# Patient Record
Sex: Female | Born: 1945 | Race: Black or African American | Hispanic: No | Smoking: Never smoker
Health system: Southern US, Community
[De-identification: ages and names within clinical notes are randomized; demographics above are authoritative.]

## PROBLEM LIST (undated history)

## (undated) DIAGNOSIS — M545 Low back pain, unspecified: Secondary | ICD-10-CM

## (undated) DIAGNOSIS — I10 Essential (primary) hypertension: Secondary | ICD-10-CM

## (undated) DIAGNOSIS — J810 Acute pulmonary edema: Secondary | ICD-10-CM

## (undated) DIAGNOSIS — E119 Type 2 diabetes mellitus without complications: Secondary | ICD-10-CM

## (undated) DIAGNOSIS — I495 Sick sinus syndrome: Secondary | ICD-10-CM

## (undated) DIAGNOSIS — N182 Chronic kidney disease, stage 2 (mild): Secondary | ICD-10-CM

## (undated) DIAGNOSIS — Z860100 Personal history of colon polyps, unspecified: Secondary | ICD-10-CM

## (undated) DIAGNOSIS — I5032 Chronic diastolic (congestive) heart failure: Secondary | ICD-10-CM

## (undated) DIAGNOSIS — N184 Chronic kidney disease, stage 4 (severe): Secondary | ICD-10-CM

## (undated) DIAGNOSIS — I2510 Atherosclerotic heart disease of native coronary artery without angina pectoris: Secondary | ICD-10-CM

## (undated) DIAGNOSIS — E785 Hyperlipidemia, unspecified: Secondary | ICD-10-CM

## (undated) DIAGNOSIS — D649 Anemia, unspecified: Secondary | ICD-10-CM

## (undated) DIAGNOSIS — M1990 Unspecified osteoarthritis, unspecified site: Secondary | ICD-10-CM

## (undated) DIAGNOSIS — K219 Gastro-esophageal reflux disease without esophagitis: Secondary | ICD-10-CM

## (undated) DIAGNOSIS — Z905 Acquired absence of kidney: Secondary | ICD-10-CM

## (undated) HISTORY — DX: Unspecified osteoarthritis, unspecified site: M19.90

## (undated) HISTORY — PX: BACK SURGERY: SHX140

## (undated) HISTORY — PX: CHOLECYSTECTOMY: SHX55

## (undated) HISTORY — PX: TONSILLECTOMY: SUR1361

## (undated) HISTORY — PX: ABDOMINAL HYSTERECTOMY: SUR658

## (undated) HISTORY — DX: Personal history of colonic polyps: Z86.010

## (undated) HISTORY — DX: Atherosclerotic heart disease of native coronary artery without angina pectoris: I25.10

## (undated) HISTORY — DX: Gastro-esophageal reflux disease without esophagitis: K21.9

## (undated) HISTORY — PX: COLONOSCOPY W/ POLYPECTOMY: SHX1380

## (undated) HISTORY — DX: Sick sinus syndrome: I49.5

## (undated) HISTORY — DX: Personal history of colon polyps, unspecified: Z86.0100

## (undated) HISTORY — DX: Type 2 diabetes mellitus without complications: E11.9

## (undated) HISTORY — DX: Essential (primary) hypertension: I10

## (undated) HISTORY — DX: Low back pain, unspecified: M54.50

## (undated) HISTORY — DX: Chronic diastolic (congestive) heart failure: I50.32

## (undated) HISTORY — DX: Hyperlipidemia, unspecified: E78.5

## (undated) HISTORY — DX: Low back pain: M54.5

## (undated) HISTORY — PX: NEPHRECTOMY: SHX65

## (undated) HISTORY — PX: INSERT / REPLACE / REMOVE PACEMAKER: SUR710

## (undated) HISTORY — DX: Chronic kidney disease, stage 2 (mild): N18.2

## (undated) HISTORY — PX: KNEE SURGERY: SHX244

## (undated) HISTORY — PX: CATARACT EXTRACTION: SUR2

---

## 1998-02-04 ENCOUNTER — Ambulatory Visit (HOSPITAL_COMMUNITY): Admission: RE | Admit: 1998-02-04 | Discharge: 1998-02-04 | Payer: Self-pay | Admitting: Cardiology

## 2000-07-22 ENCOUNTER — Ambulatory Visit (HOSPITAL_COMMUNITY): Admission: RE | Admit: 2000-07-22 | Discharge: 2000-07-22 | Payer: Self-pay | Admitting: Internal Medicine

## 2000-07-22 ENCOUNTER — Encounter: Payer: Self-pay | Admitting: Internal Medicine

## 2001-01-19 ENCOUNTER — Encounter: Payer: Self-pay | Admitting: Urology

## 2001-01-19 ENCOUNTER — Ambulatory Visit (HOSPITAL_COMMUNITY): Admission: RE | Admit: 2001-01-19 | Discharge: 2001-01-19 | Payer: Self-pay | Admitting: Urology

## 2001-01-28 ENCOUNTER — Encounter: Payer: Self-pay | Admitting: Orthopaedic Surgery

## 2001-01-28 ENCOUNTER — Ambulatory Visit (HOSPITAL_COMMUNITY): Admission: RE | Admit: 2001-01-28 | Discharge: 2001-01-28 | Payer: Self-pay | Admitting: Orthopaedic Surgery

## 2001-04-05 ENCOUNTER — Inpatient Hospital Stay (HOSPITAL_COMMUNITY): Admission: AD | Admit: 2001-04-05 | Discharge: 2001-04-08 | Payer: Self-pay | Admitting: Cardiology

## 2001-04-05 ENCOUNTER — Encounter: Payer: Self-pay | Admitting: Emergency Medicine

## 2001-04-06 ENCOUNTER — Encounter: Payer: Self-pay | Admitting: Cardiology

## 2001-04-07 ENCOUNTER — Encounter: Payer: Self-pay | Admitting: Cardiology

## 2001-07-26 ENCOUNTER — Encounter: Payer: Self-pay | Admitting: Urology

## 2001-07-26 ENCOUNTER — Ambulatory Visit (HOSPITAL_COMMUNITY): Admission: RE | Admit: 2001-07-26 | Discharge: 2001-07-26 | Payer: Self-pay | Admitting: Urology

## 2001-10-11 ENCOUNTER — Encounter: Payer: Self-pay | Admitting: Family Medicine

## 2001-10-11 ENCOUNTER — Ambulatory Visit (HOSPITAL_COMMUNITY): Admission: RE | Admit: 2001-10-11 | Discharge: 2001-10-11 | Payer: Self-pay | Admitting: Family Medicine

## 2001-11-15 ENCOUNTER — Ambulatory Visit (HOSPITAL_COMMUNITY): Admission: RE | Admit: 2001-11-15 | Discharge: 2001-11-15 | Payer: Self-pay | Admitting: Orthopaedic Surgery

## 2001-11-15 ENCOUNTER — Encounter: Payer: Self-pay | Admitting: Orthopaedic Surgery

## 2002-01-17 ENCOUNTER — Ambulatory Visit (HOSPITAL_COMMUNITY): Admission: RE | Admit: 2002-01-17 | Discharge: 2002-01-17 | Payer: Self-pay | Admitting: Orthopaedic Surgery

## 2002-01-17 ENCOUNTER — Encounter: Payer: Self-pay | Admitting: Orthopaedic Surgery

## 2002-02-11 ENCOUNTER — Encounter: Payer: Self-pay | Admitting: Emergency Medicine

## 2002-02-11 ENCOUNTER — Emergency Department (HOSPITAL_COMMUNITY): Admission: EM | Admit: 2002-02-11 | Discharge: 2002-02-11 | Payer: Self-pay | Admitting: Emergency Medicine

## 2002-03-21 ENCOUNTER — Ambulatory Visit (HOSPITAL_COMMUNITY): Admission: RE | Admit: 2002-03-21 | Discharge: 2002-03-21 | Payer: Self-pay | Admitting: Orthopaedic Surgery

## 2002-03-27 ENCOUNTER — Encounter: Payer: Self-pay | Admitting: Orthopaedic Surgery

## 2002-03-27 ENCOUNTER — Encounter: Admission: RE | Admit: 2002-03-27 | Discharge: 2002-03-27 | Payer: Self-pay | Admitting: Orthopaedic Surgery

## 2002-08-22 ENCOUNTER — Encounter: Payer: Self-pay | Admitting: Family Medicine

## 2002-08-22 ENCOUNTER — Ambulatory Visit (HOSPITAL_COMMUNITY): Admission: RE | Admit: 2002-08-22 | Discharge: 2002-08-22 | Payer: Self-pay | Admitting: Family Medicine

## 2002-09-21 DIAGNOSIS — I495 Sick sinus syndrome: Secondary | ICD-10-CM

## 2002-09-21 HISTORY — DX: Sick sinus syndrome: I49.5

## 2002-12-20 ENCOUNTER — Encounter: Payer: Self-pay | Admitting: Family Medicine

## 2002-12-20 ENCOUNTER — Ambulatory Visit (HOSPITAL_COMMUNITY): Admission: RE | Admit: 2002-12-20 | Discharge: 2002-12-20 | Payer: Self-pay | Admitting: Family Medicine

## 2003-05-24 ENCOUNTER — Encounter: Payer: Self-pay | Admitting: Family Medicine

## 2003-05-24 ENCOUNTER — Ambulatory Visit (HOSPITAL_COMMUNITY): Admission: RE | Admit: 2003-05-24 | Discharge: 2003-05-24 | Payer: Self-pay | Admitting: Family Medicine

## 2003-05-29 ENCOUNTER — Inpatient Hospital Stay (HOSPITAL_COMMUNITY): Admission: AD | Admit: 2003-05-29 | Discharge: 2003-06-01 | Payer: Self-pay | Admitting: *Deleted

## 2003-10-27 ENCOUNTER — Encounter: Admission: RE | Admit: 2003-10-27 | Discharge: 2003-10-27 | Payer: Self-pay | Admitting: Orthopaedic Surgery

## 2003-11-26 ENCOUNTER — Ambulatory Visit (HOSPITAL_COMMUNITY): Admission: RE | Admit: 2003-11-26 | Discharge: 2003-11-26 | Payer: Self-pay | Admitting: Urology

## 2004-02-11 ENCOUNTER — Ambulatory Visit (HOSPITAL_COMMUNITY): Admission: RE | Admit: 2004-02-11 | Discharge: 2004-02-11 | Payer: Self-pay | Admitting: Family Medicine

## 2004-02-14 ENCOUNTER — Emergency Department (HOSPITAL_COMMUNITY): Admission: EM | Admit: 2004-02-14 | Discharge: 2004-02-14 | Payer: Self-pay | Admitting: Emergency Medicine

## 2004-05-28 ENCOUNTER — Ambulatory Visit (HOSPITAL_COMMUNITY): Admission: RE | Admit: 2004-05-28 | Discharge: 2004-05-28 | Payer: Self-pay | Admitting: Urology

## 2004-06-04 ENCOUNTER — Ambulatory Visit (HOSPITAL_COMMUNITY): Admission: RE | Admit: 2004-06-04 | Discharge: 2004-06-04 | Payer: Self-pay | Admitting: Urology

## 2004-11-10 ENCOUNTER — Inpatient Hospital Stay (HOSPITAL_COMMUNITY): Admission: EM | Admit: 2004-11-10 | Discharge: 2004-11-13 | Payer: Self-pay | Admitting: Family Medicine

## 2004-11-10 ENCOUNTER — Ambulatory Visit: Payer: Self-pay | Admitting: Internal Medicine

## 2004-11-11 ENCOUNTER — Encounter: Payer: Self-pay | Admitting: Internal Medicine

## 2004-11-11 ENCOUNTER — Encounter: Payer: Self-pay | Admitting: Cardiology

## 2004-11-26 ENCOUNTER — Ambulatory Visit: Payer: Self-pay

## 2004-11-26 ENCOUNTER — Ambulatory Visit: Payer: Self-pay | Admitting: Internal Medicine

## 2004-11-27 ENCOUNTER — Ambulatory Visit: Payer: Self-pay | Admitting: *Deleted

## 2005-02-12 ENCOUNTER — Ambulatory Visit (HOSPITAL_COMMUNITY): Admission: RE | Admit: 2005-02-12 | Discharge: 2005-02-12 | Payer: Self-pay | Admitting: Family Medicine

## 2005-03-05 ENCOUNTER — Ambulatory Visit: Payer: Self-pay | Admitting: Internal Medicine

## 2005-05-07 ENCOUNTER — Ambulatory Visit: Payer: Self-pay | Admitting: *Deleted

## 2005-06-01 ENCOUNTER — Encounter (HOSPITAL_COMMUNITY): Admission: RE | Admit: 2005-06-01 | Discharge: 2005-06-19 | Payer: Self-pay | Admitting: *Deleted

## 2005-06-11 ENCOUNTER — Encounter: Admission: RE | Admit: 2005-06-11 | Discharge: 2005-06-11 | Payer: Self-pay | Admitting: Orthopaedic Surgery

## 2005-06-22 ENCOUNTER — Encounter (HOSPITAL_COMMUNITY): Admission: RE | Admit: 2005-06-22 | Discharge: 2005-07-22 | Payer: Self-pay | Admitting: *Deleted

## 2005-07-24 ENCOUNTER — Encounter (HOSPITAL_COMMUNITY): Admission: RE | Admit: 2005-07-24 | Discharge: 2005-08-23 | Payer: Self-pay | Admitting: *Deleted

## 2005-08-24 ENCOUNTER — Encounter (HOSPITAL_COMMUNITY): Admission: RE | Admit: 2005-08-24 | Discharge: 2005-09-18 | Payer: Self-pay | Admitting: *Deleted

## 2005-09-09 ENCOUNTER — Ambulatory Visit: Payer: Self-pay | Admitting: Cardiology

## 2005-09-15 ENCOUNTER — Ambulatory Visit: Payer: Self-pay | Admitting: Cardiology

## 2005-09-15 ENCOUNTER — Encounter (HOSPITAL_COMMUNITY): Admission: RE | Admit: 2005-09-15 | Discharge: 2005-09-16 | Payer: Self-pay | Admitting: Cardiology

## 2005-09-23 ENCOUNTER — Ambulatory Visit: Payer: Self-pay | Admitting: *Deleted

## 2005-09-23 ENCOUNTER — Encounter (HOSPITAL_COMMUNITY): Admission: RE | Admit: 2005-09-23 | Discharge: 2005-10-23 | Payer: Self-pay | Admitting: *Deleted

## 2005-09-24 ENCOUNTER — Ambulatory Visit (HOSPITAL_COMMUNITY): Admission: RE | Admit: 2005-09-24 | Discharge: 2005-09-24 | Payer: Self-pay | Admitting: *Deleted

## 2005-09-28 ENCOUNTER — Inpatient Hospital Stay (HOSPITAL_BASED_OUTPATIENT_CLINIC_OR_DEPARTMENT_OTHER): Admission: RE | Admit: 2005-09-28 | Discharge: 2005-09-28 | Payer: Self-pay | Admitting: Cardiology

## 2005-09-28 ENCOUNTER — Ambulatory Visit: Payer: Self-pay | Admitting: Cardiovascular Disease

## 2005-10-13 ENCOUNTER — Ambulatory Visit: Payer: Self-pay | Admitting: *Deleted

## 2005-11-05 ENCOUNTER — Ambulatory Visit: Payer: Self-pay | Admitting: *Deleted

## 2006-02-01 ENCOUNTER — Ambulatory Visit: Payer: Self-pay | Admitting: Internal Medicine

## 2006-04-26 ENCOUNTER — Ambulatory Visit: Payer: Self-pay | Admitting: Internal Medicine

## 2006-05-03 ENCOUNTER — Ambulatory Visit: Payer: Self-pay | Admitting: Internal Medicine

## 2006-05-21 ENCOUNTER — Ambulatory Visit: Payer: Self-pay | Admitting: *Deleted

## 2006-05-25 ENCOUNTER — Ambulatory Visit: Payer: Self-pay | Admitting: Internal Medicine

## 2006-06-15 ENCOUNTER — Ambulatory Visit: Payer: Self-pay | Admitting: Internal Medicine

## 2006-07-23 ENCOUNTER — Ambulatory Visit (HOSPITAL_COMMUNITY): Admission: RE | Admit: 2006-07-23 | Discharge: 2006-07-23 | Payer: Self-pay | Admitting: Family Medicine

## 2006-08-11 ENCOUNTER — Ambulatory Visit: Payer: Self-pay | Admitting: Internal Medicine

## 2006-09-29 ENCOUNTER — Ambulatory Visit (HOSPITAL_COMMUNITY): Admission: RE | Admit: 2006-09-29 | Discharge: 2006-09-29 | Payer: Self-pay | Admitting: Family Medicine

## 2006-10-01 ENCOUNTER — Ambulatory Visit: Payer: Self-pay | Admitting: Internal Medicine

## 2006-10-26 ENCOUNTER — Encounter: Payer: Self-pay | Admitting: Internal Medicine

## 2006-10-26 DIAGNOSIS — E119 Type 2 diabetes mellitus without complications: Secondary | ICD-10-CM

## 2006-10-26 DIAGNOSIS — I10 Essential (primary) hypertension: Secondary | ICD-10-CM | POA: Insufficient documentation

## 2006-10-26 DIAGNOSIS — M545 Low back pain: Secondary | ICD-10-CM

## 2006-10-26 DIAGNOSIS — C649 Malignant neoplasm of unspecified kidney, except renal pelvis: Secondary | ICD-10-CM | POA: Insufficient documentation

## 2006-10-26 DIAGNOSIS — K219 Gastro-esophageal reflux disease without esophagitis: Secondary | ICD-10-CM | POA: Insufficient documentation

## 2006-10-26 DIAGNOSIS — IMO0002 Reserved for concepts with insufficient information to code with codable children: Secondary | ICD-10-CM

## 2006-10-26 DIAGNOSIS — E785 Hyperlipidemia, unspecified: Secondary | ICD-10-CM

## 2006-10-26 DIAGNOSIS — J309 Allergic rhinitis, unspecified: Secondary | ICD-10-CM | POA: Insufficient documentation

## 2006-11-26 ENCOUNTER — Ambulatory Visit: Payer: Self-pay | Admitting: Internal Medicine

## 2007-01-21 ENCOUNTER — Ambulatory Visit: Payer: Self-pay | Admitting: Internal Medicine

## 2007-03-11 ENCOUNTER — Ambulatory Visit: Payer: Self-pay | Admitting: Internal Medicine

## 2007-04-18 ENCOUNTER — Ambulatory Visit: Payer: Self-pay | Admitting: Internal Medicine

## 2007-05-06 ENCOUNTER — Ambulatory Visit: Payer: Self-pay | Admitting: Internal Medicine

## 2007-07-22 ENCOUNTER — Ambulatory Visit: Payer: Self-pay | Admitting: Internal Medicine

## 2007-08-01 ENCOUNTER — Ambulatory Visit (HOSPITAL_COMMUNITY): Admission: RE | Admit: 2007-08-01 | Discharge: 2007-08-02 | Payer: Self-pay | Admitting: Internal Medicine

## 2007-08-01 ENCOUNTER — Ambulatory Visit: Payer: Self-pay | Admitting: Cardiology

## 2007-08-02 ENCOUNTER — Ambulatory Visit: Payer: Self-pay | Admitting: Cardiology

## 2007-08-04 ENCOUNTER — Ambulatory Visit: Payer: Self-pay | Admitting: Cardiology

## 2007-08-26 ENCOUNTER — Ambulatory Visit: Payer: Self-pay | Admitting: Internal Medicine

## 2007-08-29 ENCOUNTER — Ambulatory Visit (HOSPITAL_COMMUNITY): Admission: RE | Admit: 2007-08-29 | Discharge: 2007-08-29 | Payer: Self-pay | Admitting: Family Medicine

## 2007-10-26 ENCOUNTER — Ambulatory Visit: Payer: Self-pay | Admitting: Internal Medicine

## 2007-12-16 ENCOUNTER — Encounter: Admission: RE | Admit: 2007-12-16 | Discharge: 2007-12-16 | Payer: Self-pay | Admitting: Orthopaedic Surgery

## 2008-01-13 ENCOUNTER — Ambulatory Visit: Payer: Self-pay | Admitting: Internal Medicine

## 2008-02-27 ENCOUNTER — Ambulatory Visit: Payer: Self-pay | Admitting: Internal Medicine

## 2008-05-29 ENCOUNTER — Ambulatory Visit: Payer: Self-pay | Admitting: Internal Medicine

## 2008-09-18 ENCOUNTER — Emergency Department (HOSPITAL_COMMUNITY): Admission: EM | Admit: 2008-09-18 | Discharge: 2008-09-18 | Payer: Self-pay | Admitting: Emergency Medicine

## 2008-10-01 ENCOUNTER — Ambulatory Visit: Payer: Self-pay | Admitting: Internal Medicine

## 2008-12-18 DIAGNOSIS — R5383 Other fatigue: Secondary | ICD-10-CM

## 2008-12-18 DIAGNOSIS — R5381 Other malaise: Secondary | ICD-10-CM

## 2008-12-31 ENCOUNTER — Ambulatory Visit: Payer: Self-pay | Admitting: Internal Medicine

## 2009-04-01 ENCOUNTER — Ambulatory Visit: Payer: Self-pay | Admitting: Internal Medicine

## 2009-04-01 ENCOUNTER — Encounter: Payer: Self-pay | Admitting: Internal Medicine

## 2009-04-19 ENCOUNTER — Encounter (INDEPENDENT_AMBULATORY_CARE_PROVIDER_SITE_OTHER): Payer: Self-pay | Admitting: *Deleted

## 2009-04-24 ENCOUNTER — Ambulatory Visit (HOSPITAL_COMMUNITY): Admission: RE | Admit: 2009-04-24 | Discharge: 2009-04-24 | Payer: Self-pay | Admitting: Family Medicine

## 2009-06-05 ENCOUNTER — Encounter (HOSPITAL_COMMUNITY): Admission: RE | Admit: 2009-06-05 | Discharge: 2009-06-20 | Payer: Self-pay | Admitting: Orthopedic Surgery

## 2009-06-07 ENCOUNTER — Ambulatory Visit: Payer: Self-pay | Admitting: Gastroenterology

## 2009-06-07 DIAGNOSIS — Z860100 Personal history of colon polyps, unspecified: Secondary | ICD-10-CM | POA: Insufficient documentation

## 2009-06-10 ENCOUNTER — Encounter: Payer: Self-pay | Admitting: Gastroenterology

## 2009-06-12 ENCOUNTER — Encounter (INDEPENDENT_AMBULATORY_CARE_PROVIDER_SITE_OTHER): Payer: Self-pay | Admitting: *Deleted

## 2009-06-12 LAB — CONVERTED CEMR LAB
BUN: 28 mg/dL
CO2: 27 meq/L
Chloride: 102 meq/L
Creatinine, Ser: 1.18 mg/dL
Glucose, Bld: 117 mg/dL
Hgb A1c MFr Bld: 8.3 %
LDL Cholesterol: 153 mg/dL
Total Protein: 7.6 g/dL

## 2009-06-18 ENCOUNTER — Ambulatory Visit (HOSPITAL_COMMUNITY): Admission: RE | Admit: 2009-06-18 | Discharge: 2009-06-18 | Payer: Self-pay | Admitting: Gastroenterology

## 2009-06-18 ENCOUNTER — Ambulatory Visit: Payer: Self-pay | Admitting: Gastroenterology

## 2009-06-24 ENCOUNTER — Encounter (HOSPITAL_COMMUNITY): Admission: RE | Admit: 2009-06-24 | Discharge: 2009-07-24 | Payer: Self-pay | Admitting: Orthopedic Surgery

## 2009-07-01 ENCOUNTER — Ambulatory Visit: Payer: Self-pay | Admitting: Internal Medicine

## 2009-10-15 ENCOUNTER — Ambulatory Visit: Payer: Self-pay | Admitting: Internal Medicine

## 2009-10-16 ENCOUNTER — Encounter (INDEPENDENT_AMBULATORY_CARE_PROVIDER_SITE_OTHER): Payer: Self-pay | Admitting: *Deleted

## 2009-12-10 ENCOUNTER — Encounter (INDEPENDENT_AMBULATORY_CARE_PROVIDER_SITE_OTHER): Payer: Self-pay | Admitting: *Deleted

## 2009-12-15 DIAGNOSIS — I495 Sick sinus syndrome: Secondary | ICD-10-CM

## 2009-12-15 DIAGNOSIS — Z950 Presence of cardiac pacemaker: Secondary | ICD-10-CM

## 2009-12-16 ENCOUNTER — Ambulatory Visit (HOSPITAL_COMMUNITY): Admission: RE | Admit: 2009-12-16 | Discharge: 2009-12-16 | Payer: Self-pay | Admitting: Cardiology

## 2009-12-16 ENCOUNTER — Ambulatory Visit: Payer: Self-pay | Admitting: Cardiology

## 2009-12-17 ENCOUNTER — Encounter: Payer: Self-pay | Admitting: Cardiology

## 2009-12-17 LAB — CONVERTED CEMR LAB: Pro B Natriuretic peptide (BNP): 21.2 pg/mL (ref 0.0–100.0)

## 2009-12-18 ENCOUNTER — Encounter: Payer: Self-pay | Admitting: Cardiology

## 2009-12-19 ENCOUNTER — Encounter (INDEPENDENT_AMBULATORY_CARE_PROVIDER_SITE_OTHER): Payer: Self-pay | Admitting: *Deleted

## 2009-12-30 ENCOUNTER — Ambulatory Visit: Admission: RE | Admit: 2009-12-30 | Discharge: 2009-12-30 | Payer: Self-pay | Admitting: *Deleted

## 2010-01-06 ENCOUNTER — Ambulatory Visit: Payer: Self-pay | Admitting: Pulmonary Disease

## 2010-01-10 ENCOUNTER — Ambulatory Visit (HOSPITAL_COMMUNITY): Admission: RE | Admit: 2010-01-10 | Discharge: 2010-01-10 | Payer: Self-pay | Admitting: Cardiology

## 2010-01-10 ENCOUNTER — Encounter: Payer: Self-pay | Admitting: Cardiology

## 2010-01-13 ENCOUNTER — Encounter (INDEPENDENT_AMBULATORY_CARE_PROVIDER_SITE_OTHER): Payer: Self-pay | Admitting: *Deleted

## 2010-01-15 ENCOUNTER — Encounter: Payer: Self-pay | Admitting: *Deleted

## 2010-01-17 ENCOUNTER — Encounter: Payer: Self-pay | Admitting: Cardiology

## 2010-01-27 ENCOUNTER — Ambulatory Visit: Payer: Self-pay | Admitting: Cardiology

## 2010-01-27 ENCOUNTER — Encounter: Payer: Self-pay | Admitting: Internal Medicine

## 2010-01-27 ENCOUNTER — Encounter (INDEPENDENT_AMBULATORY_CARE_PROVIDER_SITE_OTHER): Payer: Self-pay | Admitting: *Deleted

## 2010-01-27 DIAGNOSIS — M109 Gout, unspecified: Secondary | ICD-10-CM | POA: Insufficient documentation

## 2010-01-28 ENCOUNTER — Encounter: Payer: Self-pay | Admitting: Cardiology

## 2010-01-29 ENCOUNTER — Telehealth (INDEPENDENT_AMBULATORY_CARE_PROVIDER_SITE_OTHER): Payer: Self-pay | Admitting: *Deleted

## 2010-03-21 ENCOUNTER — Encounter (INDEPENDENT_AMBULATORY_CARE_PROVIDER_SITE_OTHER): Payer: Self-pay | Admitting: *Deleted

## 2010-03-21 LAB — CONVERTED CEMR LAB
CO2: 27 meq/L
Chloride: 98 meq/L
Creatinine, Ser: 1.22 mg/dL
LDL Cholesterol: 138 mg/dL
Sodium: 137 meq/L

## 2010-03-26 ENCOUNTER — Ambulatory Visit: Payer: Self-pay | Admitting: Cardiovascular Disease

## 2010-03-28 ENCOUNTER — Encounter (INDEPENDENT_AMBULATORY_CARE_PROVIDER_SITE_OTHER): Payer: Self-pay | Admitting: *Deleted

## 2010-03-28 LAB — CONVERTED CEMR LAB
CO2: 27 meq/L (ref 19–32)
Calcium: 9.4 mg/dL (ref 8.4–10.5)
HDL: 30 mg/dL — ABNORMAL LOW (ref >39)
Potassium: 3.7 meq/L (ref 3.5–5.3)
Sodium: 137 meq/L (ref 135–145)
Total CHOL/HDL Ratio: 7.6
Uric Acid, Serum: 7.5 mg/dL — ABNORMAL HIGH (ref 2.4–7.0)

## 2010-04-01 ENCOUNTER — Encounter (INDEPENDENT_AMBULATORY_CARE_PROVIDER_SITE_OTHER): Payer: Self-pay | Admitting: *Deleted

## 2010-08-15 ENCOUNTER — Encounter (INDEPENDENT_AMBULATORY_CARE_PROVIDER_SITE_OTHER): Payer: Self-pay | Admitting: *Deleted

## 2010-09-25 ENCOUNTER — Ambulatory Visit: Admit: 2010-09-25 | Payer: Self-pay | Admitting: Internal Medicine

## 2010-09-26 ENCOUNTER — Telehealth (INDEPENDENT_AMBULATORY_CARE_PROVIDER_SITE_OTHER): Payer: Self-pay

## 2010-10-10 ENCOUNTER — Encounter: Payer: Self-pay | Admitting: Internal Medicine

## 2010-10-12 ENCOUNTER — Encounter: Payer: Self-pay | Admitting: Family Medicine

## 2010-10-21 NOTE — Assessment & Plan Note (Signed)
Summary: ec6/sorness on side with pacemaker  Medications Added * LASIX 20 MG TABS (FUROSEMIDE) takes prn TRIBENZOR 40-10-25 MG TABS (OLMESARTAN-AMLODIPINE-HCTZ) take 1 tab daily FISH OIL 500 MG CAPS (OMEGA-3 FATTY ACIDS) take 1 cap two times a day B COMPLEX  TABS (B COMPLEX VITAMINS) take 1 tab daily SPIRONOLACTONE 25 MG TABS (SPIRONOLACTONE) take 1 tab two times a day SIMVASTATIN 20 MG TABS (SIMVASTATIN) Take one tablet by mouth daily at bedtime PRAVACHOL 20 MG TABS (PRAVASTATIN SODIUM) Take 1 tablet by mouth once a day      Allergies Added: ! SIMVASTATIN  Visit Type:  Follow-up Primary Provider:  Dr. Mirna Mires   History of Present Illness: Alyssa [name] is seen in the office today at her request, having been lost to followup since 2009.  She was last seen by Dr. Tenny Craw in preparation for a major orthopaedic surgical procedure to correct her scoliosis.  She did well with sustained relief of her low back pain; however, she has subsequently developed upper back pain.  She is able to walk only short distances without dyspnea.  She is disabled due to chronic back problems, walks with a limp and cannot exercise on a treadmill.  She has had no chest discomfort, orthopnea, PND or palpitations.  Her peak weight has been 246 in recent years.  She lost approximately 35 pounds following surgery, but has subsequently regained to 229, 11 pounds more than in April of 2009.  Finally, she complains of left upper chest aching discomfort of mild to moderate intensity radiating to the left shoulder.  Symptoms are increased with movement of the limb.  There is no chest wall tenderness nor is there a pleuritic component.  Alyssa [name] has been told of significant snoring.  She notes daytime somnolence and fatigue.  She has never been assessed for sleep apnea.    Current Medications (verified): 1)  Prilosec 20 Mg Cpdr (Omeprazole) .... Two Times A Day 2)  Lasix 20 Mg Tabs (Furosemide) .... Takes Prn 3)   Catapres 0.3 Mg Tabs (Clonidine Hcl) .... Take 1 Tablet By Mouth Three Times A Day 4)  Metoprolol Tartrate 50 Mg Tabs (Metoprolol Tartrate) .... Two Times A Day 5)  Allopurinol 100 Mg Tabs (Allopurinol) .... Take 1 Tablet By Mouth Once A Day 6)  Humerlin  70/30 .... Sliding Scale 7)  Tribenzor 40-10-25 Mg Tabs (Olmesartan-Amlodipine-Hctz) .... Take 1 Tab Daily 8)  Fish Oil 500 Mg Caps (Omega-3 Fatty Acids) .... Take 1 Cap Two Times A Day 9)  B Complex  Tabs (B Complex Vitamins) .... Take 1 Tab Daily 10)  Spironolactone 25 Mg Tabs (Spironolactone) .... Take 1 Tab Two Times A Day 11)  Pravachol 20 Mg Tabs (Pravastatin Sodium) .... Take 1 Tablet By Mouth Once A Day  Allergies (verified): 1)  ! Simvastatin  Past History:  PMH, FH, and Social History reviewed and updated.  Review of Systems       See history of present illness.  Vital Signs:  Patient profile:   65 year old female Weight:      229 pounds O2 Sat:      99 % on Room air Pulse rate:   64 / minute BP sitting:   158 / 65  (right arm)  Vitals Entered By: Dreama Saa, CNA (December 16, 2009 2:24 PM)  O2 Flow:  Room air  Physical Exam  General:    Obese; well developed; no acute distress:   Neck-No JVD; no carotid bruits: Lungs-No tachypnea,  no rales; no rhonchi; no wheezes: Thorax-healed surgical scar extending from the upper thoracic region to the lower lumbar region; pacing generator in the left infraclavicular area.  Tenderness where leads exit from the device.  No evidence for infection. Cardiovascular-normal PMI; normal S1 and S2; modest systolic murmur Abdomen-BS normal; soft and non-tender without masses or organomegaly:  Musculoskeletal-No deformities, no cyanosis or clubbing: Neurologic-Normal cranial nerves; symmetric strength and tone:  Skin-Warm, no significant lesions: Extremities-Nl distal pulses; trace edema:     Impression & Recommendations:  Problem # 1:  SICK SINUS SYNDROME  (ICD-427.81) Pacemaker function was appropriate when last assessed in the office nearly 2 years ago.  Patient has been involved with transtelephonic monitoring since then.  A return visit for pacer interrogation will be scheduled with Dr. Ladona Ridgel.  Problem # 2:  HYPERTENSION (ICD-401.9) Blood pressure control is somewhat suboptimal.  Medical therapy will likely need to be intensified.  Problem # 3:  DYSPNEA (ICD-786.05) Dyspnea is Alyssa [name]'s most significant symptom.  Based upon her previous normal cardiac test, this is not likely of cardiac origin.  Obesity may be causing sleep apnea and/or restrictive lung disease.  A chest x-ray, PFTs and a sleep study will be obtained.  A 6 minute walk was performed.  Exercise tolerance was poor with the patient covering only 300 feet.  Oxygen saturation decreased modestly with exercise.  Initial rhythm was sinus bradycardia competing with the electronic pacemaker plus a junctional rhythm.  With exercise, sinus rhythm was present with atrial sensing and ventricular pacing.  It is unclear at this point whether his symptoms could all be explained by the patient's obesity.  Alternative explanations will be sought as appropriate.  I will see this nice woman again in one month.  Orders: T-TSH [phone]) T-BNP  (B Natriuretic Peptide) [phone]) T-D-Dimer Fibrin Derivatives Quantitive [phone]) T-Chest x-ray, 2 views (71020)  Problem # 4:  CHEST PAIN (ICD-786.50) Discomfort in the region of the patient's pacemaker appears benign.  Body temperature was normal today at 98.6.  There no signs to indicate infection.  Dr. Ladona Ridgel will reassess at his next office visit.  Problem # 5:  HYPERLIPIDEMIA (ICD-272.4) Due to moderate hyperlipidemia in the setting of diabetes, pharmacologic therapy is warranted.  Patient has taken simvastatin in the past, but developed her right is.  We will try pravastatin at an initial dose of 20 mg q.d., increasing the dose as  required.  Other Orders: Sleep Disorder Referral (Sleep Disorder) Pulmonary Function Test (PFT)  Patient Instructions: 1)  Your physician recommends that you schedule a follow-up appointment in: 1 month 2)  Your physician recommends that you return for lab work in: today 3)  Your physician has recommended you make the following change in your medication: start pravachol 20mg  once daily 4)  Your physician has recommended that you have a pulmonary function test.  Pulmonary Function Tests are a group of tests that measure how well air moves in and out of your lungs. 5)  Your physician has recommended that you have a sleep study.  This test records several body functions during sleep, including:  brain activity, eye movement, oxygen and carbon dioxide blood levels, heart rate and rhythm, breathing rate and rhythm, the flow of air through your mouth and nose, snoring, body muscle movements, and chest and belly movement. Prescriptions: PRAVACHOL 20 MG TABS (PRAVASTATIN SODIUM) Take 1 tablet by mouth once a day  #30 x 2   Entered by:   Teressa Lower RN  Authorized by:   Kathlen Brunswick, MD, [facility]   Signed by:   Teressa Lower RN on 12/16/2009   Method used:   Electronically to        [facility] [phone]* (retail)       [address]       [place], [place]  [address]       Ph: [phone] or [phone]       Fax: [phone]   RxID:   [id number] SIMVASTATIN 20 MG TABS (SIMVASTATIN) Take one tablet by mouth daily at bedtime  #30 x 1   Entered by:   Teressa Lower RN   Authorized by:   Kathlen Brunswick, MD, [facility]   Signed by:   Teressa Lower RN on 12/16/2009   Method used:   Electronically to        [facility] [phone]* (retail)       [address]       [place], [place]  [address]       Ph: [phone] or [phone]       Fax: [phone]   RxID:   [id number]

## 2010-10-21 NOTE — Letter (Signed)
Summary: [place] [facility] Work Engineer, agricultural at [employer]  [address], [place] [address]   Phone: [phone]  Fax: [phone]     March 28, 2010 MRN: [id number]   [facility] [address] [place], [place]  [address]      YOUR LAB WORK IS DUE   April 29, 2010  Please go to [facility], located across the street from [facility] on the second floor.  Hours are Monday - Friday 7am until 7:30pm         Saturday 8am until 12noon    _x_  DO NOT EAT OR DRINK AFTER MIDNIGHT EVENING PRIOR TO LABWORK  __ YOUR LABWORK IS NOT FASTING --YOU MAY EAT PRIOR TO LABWORK

## 2010-10-21 NOTE — Assessment & Plan Note (Signed)
Summary: 1 MTH F/U PER CHECKOUT ON 12/16/09/TG  Medications Added * LASIX 20 MG TABS (FUROSEMIDE) take 1 1/2 tablets once daily as needed CLONIDINE HCL 0.3 MG/24HR PTWK (CLONIDINE HCL) once weekly CLONIDINE HCL 0.3 MG/24HR PTWK (CLONIDINE HCL) once weekly NOVOLIN 70/30 70-30 % SUSP (INSULIN ISOPHANE & REGULAR) 60 units  in am  and 15 units in pm FISH OIL 1000 MG CAPS (OMEGA-3 FATTY ACIDS) Take 1 tablet by mouth two times a day MULTIVITAMINS  CAPS (MULTIPLE VITAMIN) Take 1 tablet by mouth once a day VERAPAMIL HCL CR 180 MG XR24H-CAP (VERAPAMIL HCL) Take one capsule by mouth daily CHLORTHALIDONE 25 MG TABS (CHLORTHALIDONE) Take 1/2  tablet by mouth daily METOPROLOL TARTRATE 50 MG TABS (METOPROLOL TARTRATE) Take one tablet by mouth twice a day METOPROLOL SUCCINATE 50 MG XR24H-TAB (METOPROLOL SUCCINATE) Take1/2  tablet by mouth daily VERAPAMIL HCL CR 180 MG XR24H-CAP (VERAPAMIL HCL) Take one capsule by mouth daily      Allergies Added:   Visit Type:  Follow-up Referring Provider:  . Primary Provider:  Dr. Mirna Mires   History of Present Illness: [name] returns to the office as scheduled for continued assessment and treatment of multiple issues including hypertension, intermittent pedal edema, hyperlipidemia and exertional dyspnea.  Since her last visit, she has had increasing problems with back PAIN.  This has resulted in a substantial decrease in physical activity.  She requires furosemide only occasionally for pedal edema.  She is uncertain as to exactly what medication she is taking.  Current Medications (verified): 1)  Prilosec 20 Mg Cpdr (Omeprazole) .... Two Times A Day 2)  Lasix 20 Mg Tabs (Furosemide) .... Take 1 1/2 Tablets Once Daily As Needed 3)  Clonidine Hcl 0.3 Mg/24hr Ptwk (Clonidine Hcl) .... Once Weekly 4)  Allopurinol 100 Mg Tabs (Allopurinol) .... Take 1 Tablet By Mouth Once A Day 5)  Novolin 70/30 70-30 % Susp (Insulin Isophane & Regular) .... 60 Units  in Am   and 15 Units in Pm 6)  Tribenzor 40-10-25 Mg Tabs (Olmesartan-Amlodipine-Hctz) .... Take 1 Tab Daily 7)  Fish Oil 1000 Mg Caps (Omega-3 Fatty Acids) .... Take 1 Tablet By Mouth Two Times A Day 8)  Multivitamins  Caps (Multiple Vitamin) .... Take 1 Tablet By Mouth Once A Day 9)  Pravachol 20 Mg Tabs (Pravastatin Sodium) .... Take 1 Tablet By Mouth Once A Day 10)  Metoprolol Tartrate 50 Mg Tabs (Metoprolol Tartrate) .... Take One Tablet By Mouth Twice A Day 11)  Verapamil Hcl Cr 180 Mg Xr24h-Cap (Verapamil Hcl) .... Take One Capsule By Mouth Daily  Allergies (verified): 1)  ! Simvastatin  Past History:  PMH, FH, and Social History reviewed and updated.  Review of Systems       See history of present illness.  Vital Signs:  Patient profile:   65 year old female Weight:      231 pounds BMI:     39.79 Pulse rate:   64 / minute BP sitting:   200 / 69  (right arm)  Vitals Entered By: Dreama Saa, CNA (Jan 27, 2010 1:17 PM)  Physical Exam  General:    Obese; well developed; no acute distress:   Neck-No JVD; no carotid bruits: Lungs-No tachypnea, no rales; no rhonchi; no wheezes: Thorax-healed surgical scar extending from the upper thoracic region to the lower lumbar region; pacing generator in the left infraclavicular area.  Tenderness where leads exit from the device. Cardiovascular-normal PMI; normal S1 and S2; modest  systolic murmur Abdomen-BS normal; soft and non-tender without masses or organomegaly:  Musculoskeletal-No deformities, no cyanosis or clubbing: Neurologic-Normal cranial nerves; symmetric strength and tone:  Skin-Warm, no significant lesions: Extremities-Nl distal pulses; trace edema:      PPM Follow Up Remote Check?  No Battery Voltage:  2.79 V     Battery Est. Longevity:  5.5 years     Pacer Dependent:  Yes       PPM Device Measurements Atrium  Amplitude: 2.8 mV, Impedance: 451 ohms, Threshold: 0.5 V at 0.4 msec Right Ventricle  Impedance: 464 ohms,  Threshold: 0.5 V at 0.4 msec  Episodes MS Episodes:  208     Percent Mode Switch:  0.25     Coumadin:  No Ventricular High Rate:  0     Atrial Pacing:  56.2%     Ventricular Pacing:  100%  Parameters Mode:  DDD     Lower Rate Limit:  60     Upper Rate Limit:  120 Paced AV Delay:  150     Sensed AV Delay:  120 Next Cardiology Appt Due:  07/22/2010 Tech Comments:  No parameter changes.  Device function normal.  ROV 6 months with Dr. Ladona Ridgel in [place]. Altha Harm, LPN  Jan 27, 9322 1:56 PM   Impression & Recommendations:  Problem # 1:  SOMNOLENCE (ICD-780.09) Sleep study showed only a few mildly obstructive episodes and was considered negative for apnea.  Symptoms may be more related to treatment with oral clonidine.  We will attempt to switch her to transdermal clonidine if her insurance will allow.  Otherwise, we will taper that drug and provide her with different antihypertensive agents.  Problem # 2:  DYSPNEA (ICD-786.05) PFTs show mild restrictive disease consistent with the patient's obesity.  There does not appear to be intrinsic lung disease.  D-dimer level was just slightly above the normal range providing no significant evidence for thromboembolism.  BNP level was entirely normal as was TSH.  Her dyspnea is likely related to physical deconditioning and obesity.  These problems are complicated by her back pain.  There will be no easy solution, but I suggested increased activity, perhaps with consideration of water aerobics.  Problem # 3:  CARDIAC PACEMAKER IN SITU (ICD-V45.01) Patient canceled her appointment last month for pacemaker assessment.  Fortunately, we can provide that service today.  Problem # 4:  HYPERTENSION (ICD-401.9) Blood pressure control is suboptimal.  Repeat measurement in the left arm sitting was 175/70.  She will require additional antihypertensive medication; however, we do not know exactly what she is taking.  We will review her medications when she can bring them  to the office.  Patient will return for nursing visit in one month to reassess blood pressure control.  She will check BP at home or at a local pharmacy if possible.  I will plan to see her again in 6 months.  Problem # 5:  HYPERLIPIDEMIA (ICD-272.4)  She is tolerating low-dose pravastatin without the symptoms caused by moderate dose simvastatin.  A repeat lipid profile will be obtained.  Future Orders: T-Lipid Profile [id number]) ... 02/27/2010  Problem # 6:  GOUT (ICD-274.9)  She is on a low dose of allopurinol.  A uric acid level will be obtained.  Future Orders: T- * Misc. Laboratory test [phone]) ... 02/27/2010  Other Orders: Future Orders: T-Basic Metabolic Panel [id number]) ... 02/27/2010  Patient Instructions: 1)  Your physician recommends that you schedule a follow-up appointment in: 6 months 2)  Your physician recommends that you return for lab work in: 1 month 3)  Your physician has recommended you make the following change in your medication: change clonidine to patches  once weekly 4)  You have been referred to nurse visit 1   month 5)  Your physician has requested that you regularly monitor and record your blood pressure readings at home.  Please use the same machine at the same time of day to check your readings and record them to bring to your follow-up visit. Prescriptions: METOPROLOL SUCCINATE 50 MG XR24H-TAB (METOPROLOL SUCCINATE) Take1/2  tablet by mouth daily  #15 x 3   Entered by:   Teressa Lower RN   Authorized by:   Kathlen Brunswick, MD, [facility]   Signed by:   Teressa Lower RN on 01/27/2010   Method used:   Electronically to        [facility] [phone]* (retail)       [address]       [place], [place]  [address]       Ph: [phone] or [phone]       Fax: [phone]   RxID:   [id number] CHLORTHALIDONE 25 MG TABS (CHLORTHALIDONE) Take 1/2  tablet by mouth daily  #15 x 3   Entered by:   Teressa Lower RN   Authorized by:    Kathlen Brunswick, MD, [facility]   Signed by:   Teressa Lower RN on 01/27/2010   Method used:   Electronically to        [facility] [phone]* (retail)       [address]       [place], [place]  [address]       Ph: [phone] or [phone]       Fax: [phone]   RxID:   [id number] VERAPAMIL HCL CR 180 MG XR24H-CAP (VERAPAMIL HCL) Take one capsule by mouth daily  #30 x 6   Entered by:   Teressa Lower RN   Authorized by:   Kathlen Brunswick, MD, [facility]   Signed by:   Teressa Lower RN on 01/27/2010   Method used:   Electronically to        [facility] [phone]* (retail)       [address]       [place], [place]  [address]       Ph: [phone] or [phone]       Fax: [phone]   RxID:   [id number] CLONIDINE HCL 0.3 MG/24HR PTWK (CLONIDINE HCL) once weekly  #4 x 6   Entered by:   Teressa Lower RN   Authorized by:   Kathlen Brunswick, MD, [facility]   Signed by:   Teressa Lower RN on 01/27/2010   Method used:   Electronically to        [facility] [phone]* (retail)       [address]       [place], [place]  [address]       Ph: [phone] or [phone]       Fax: [phone]   RxID:   [id number]

## 2010-10-21 NOTE — Letter (Signed)
Summary: [place] [facility] Work Engineer, agricultural at [employer]  [address] [place] [address]   Phone: [phone]  Fax: [phone]     Jan 27, 2010 MRN: [id number]   [facility] [address] [place], [place]  [address]      YOUR LAB WORK IS DUE  February 27, 2010 _________________________________________  Please go to [facility], located across the street from [facility] on the second floor.  Hours are Monday - Friday 7am until 7:30pm         Saturday 8am until 12noon    _x_  DO NOT EAT OR DRINK AFTER MIDNIGHT EVENING PRIOR TO LABWORK  __ YOUR LABWORK IS NOT FASTING --YOU MAY EAT PRIOR TO LABWORK

## 2010-10-21 NOTE — Cardiovascular Report (Signed)
Summary: TTM   TTM   Imported By: Roderic Ovens 11/18/2009 13:25:40  _____________________________________________________________________  External Attachment:    Type:   Image     Comment:   External Document

## 2010-10-21 NOTE — Assessment & Plan Note (Signed)
Summary: 1 MTH NURSE VISIT PER CHECKOUT ON 01/27/10/TG  Nurse Visit   Vital Signs:  Patient profile:   65 year old female Height:      64 inches Weight:      230 pounds O2 Sat:      96 % on Room air Pulse rate:   60 / minute BP sitting:   180 / 76  (left arm)  Vitals Entered By: Teressa Lower RN (March 26, 2010 11:21 AM)  O2 Flow:  Room air  Preventive Screening-Counseling & Management  Alcohol-Tobacco     Smoking Status: quit > 6 months  Current Medications (verified): 1)  Prilosec 20 Mg Cpdr (Omeprazole) .... Two Times A Day 2)  Lasix 20 Mg Tabs (Furosemide) .... Take 1 1/2 Tablets Once Daily As Needed 3)  Clonidine Hcl 0.3 Mg/24hr Ptwk (Clonidine Hcl) .... Once Weekly 4)  Allopurinol 100 Mg Tabs (Allopurinol) .... Take 1 Tablet By Mouth Once A Day 5)  Novolin 70/30 70-30 % Susp (Insulin Isophane & Regular) .... 60 Units  in Am  and 15 Units in Pm 6)  Tribenzor 40-10-25 Mg Tabs (Olmesartan-Amlodipine-Hctz) .... Take 1 Tab Daily 7)  Fish Oil 1000 Mg Caps (Omega-3 Fatty Acids) .... Take 1 Tablet By Mouth Two Times A Day 8)  Multivitamins  Caps (Multiple Vitamin) .... Take 1 Tablet By Mouth Once A Day 9)  Pravachol 20 Mg Tabs (Pravastatin Sodium) .... Take 1 Tablet By Mouth Once A Day 10)  Metoprolol Tartrate 50 Mg Tabs (Metoprolol Tartrate) .... Take One Tablet By Mouth Twice A Day 11)  Diltiazem Hcl Er Beads 120 Mg Xr24h-Cap (Diltiazem Hcl Er Beads) .... Take One Capsule By Mouth Daily  Allergies (verified): 1)  ! Simvastatin 2)  ! Verapamil Hcl (Verapamil Hcl)   Visit Type:  1 month nurse visit Referring Provider:  . Primary Provider:  Dr. Mirna Mires   History of Present Illness: S:1 month nurse visit B: pt last seen 01/27/10, pt was placed on verapamil 180mg  daily-verapamil is a new allergy  pt is now on diltiazem 120mg  daily A:pt denies complaints , bp diary  returned today   52 readings  average sbp    144                  average dbp   73 scanned bp diary into  record R:   Appended Document: 1 MTH NURSE VISIT PER CHECKOUT ON 01/27/10/TG I spoke with pt, continue current tx

## 2010-10-21 NOTE — Progress Notes (Signed)
  Medications Added DILTIAZEM HCL ER BEADS 120 MG XR24H-CAP (DILTIAZEM HCL ER BEADS) Take one capsule by mouth daily       Phone Note Call from Patient   Caller: Patient Call For: medication intolerance Summary of Call: S:  office visit on Monday B: started on verapamil 180mg  daily A:  pt c/o  feeling very jittery, restless and unable to sleep since she started this medication        no other changes were noted per pt, sbp has been 150-180's, didn't start the clonidine patches until yesterday      so she know it was the verapamil, she stated she would not be able to tolerate that kind of a side effect R:   Initial call taken by: Teressa Lower RN,  Jan 29, 2010 5:26 PM  Follow-up for Phone Call        D/C verapamil;  add to allergy list Diltiazem 120 mg once daily F/U as planned. Follow-up by: Kathlen Brunswick, MD, [facility],  Jan 30, 2010 1:09 PM  Additional Follow-up for Phone Call Additional follow up Details #1::        [facility] Additional Follow-up by: Teressa Lower RN,  Jan 30, 2010 2:52 PM   New Allergies: ! VERAPAMIL HCL (VERAPAMIL HCL) New/Updated Medications: DILTIAZEM HCL ER BEADS 120 MG XR24H-CAP (DILTIAZEM HCL ER BEADS) Take one capsule by mouth daily New Allergies: ! VERAPAMIL HCL (VERAPAMIL HCL)Prescriptions: DILTIAZEM HCL ER BEADS 120 MG XR24H-CAP (DILTIAZEM HCL ER BEADS) Take one capsule by mouth daily  #30 x 3   Entered by:   Teressa Lower RN   Authorized by:   Kathlen Brunswick, MD, [facility]   Signed by:   Teressa Lower RN on 01/30/2010   Method used:   Electronically to        [facility] [phone]* (retail)       [address]       [place], [place]  [address]       Ph: [phone] or [phone]       Fax: [phone]   RxID:   [id number]

## 2010-10-21 NOTE — Letter (Signed)
Summary: [place] Results Engineer, agricultural at [facility]  [address] [place] [address]   Phone: [phone]  Fax: [phone]      April 01, 2010 MRN: [id number]   [facility] [address] [place], [place]  [address]   Dear Ms. [facility],  Your test ordered by Selena Batten has been reviewed by your physician (or physician assistant) and was found to be normal or stable. Your physician (or physician assistant) felt no changes were needed at this time.  ____ Echocardiogram  ____ Cardiac Stress Test  __x__ Lab Work  ____ Peripheral vascular study of arms, legs or neck  ____ CT scan or X-ray  ____ Lung or Breathing test  ____ Other: Please increase your pravachol to 80mg  daily, we will repeat a lipid profile in 1 month, per Dr. Dietrich Pates.  Enclosed is a copy of your labwork for your records.  Thank you, Tammy Allyne Gee RN    [place] Bing, MD, [facility].C.Gaylord Shih, MD, F.A.C.C Lewayne Bunting, MD, F.A.C.C Nona Dell, MD, F.A.C.C Charlton Haws, MD, [facility].C.C

## 2010-10-21 NOTE — Miscellaneous (Signed)
Summary: PFT & SLEEP STUDY   Clinical Lists Changes  Observations: Added new observation of PFT RSLT: This study is being done for dyspnea.      1. Spirometry shows a mild ventilatory defect without definite       evidence of airflow obstruction.   2. Lung volumes show reduction in total lung capacity, suggesting       restrictive change.   3. DLCO is moderately reduced.   4. Arterial blood gas shows relative resting hypoxia, otherwise       normal.   5. There is no significant bronchodilator improvement.   6. Noting the patient's height and weight, some of the restrictive       change may be on the basis of body habitus and clinical correlation       is suggested.               [name], M.D.  (01/13/2010 11:02) Added new observation of SLEEP STUDY:    IMPRESSIONS-RECOMMENDATIONS:   1. Small numbers of obstructive events which do not meet the apnea-       hypopnea index criteria for the obstructive       sleep apnea syndrome.   2. The patient was noted to have a paced rhythm throughout the study.               [name], MD,FCCP  (12/30/2009 11:07)      PFT  Procedure date:  01/13/2010  Findings:      This study is being done for dyspnea.      1. Spirometry shows a mild ventilatory defect without definite       evidence of airflow obstruction.   2. Lung volumes show reduction in total lung capacity, suggesting       restrictive change.   3. DLCO is moderately reduced.   4. Arterial blood gas shows relative resting hypoxia, otherwise       normal.   5. There is no significant bronchodilator improvement.   6. Noting the patient's height and weight, some of the restrictive       change may be on the basis of body habitus and clinical correlation       is suggested.               [name], M.D.   Sleep Study  Procedure date:  12/30/2009  Findings:         IMPRESSIONS-RECOMMENDATIONS:   1. Small numbers of obstructive events  which do not meet the apnea-       hypopnea index criteria for the obstructive       sleep apnea syndrome.   2. The patient was noted to have a paced rhythm throughout the study.               [name], MD,FCCP

## 2010-10-21 NOTE — Miscellaneous (Signed)
Summary: nuclear study 08/02/2007  Clinical Lists Changes  Observations: Added new observation of NUCLEAR NOS:  Clinical data:   65 year old woman with dyspnea and a history of mild   coronary artery disease.   ADENOSINE STRESS MYOVIEW:   RADIONUCLIDE DATA:  Two day stress/rest protocol performed with   30.0/20.0 mCi Tc8m Myoview.   STRESS DATA:  Adenosine infusion resulted in malaise and chest   tightness.  There was a modest increase in heart rate and decrease in   systolic blood pressure with drug administration.  No arrhythmias   noted.   EKG:  Atrial synchronous ventricular pacing.   SCINTIGRAPHIC DATA:  Acquisition notable for moderate breast   attenuation resulting in decreased image quality.  There was very   mild left ventricular dilatation.  On tomographic images   reconstructed in standard planes, distribution of tracer was   generally patchy.  There was a small apical defect of borderline   numeric significance; no reversibility was seen in this region.  The   gated reconstruction revealed normal regional and global LV systolic   function as well as normal systolic accentuation of activity   throughout.  Estimated ejection fraction was .55.   IMPRESSION:   Negative pharmacologic stress Myoview study revealing an   uninterpretable EKG due to the presence of right ventricular pacing,   very mild left ventricular dilatation, normal left ventricular   function and prominent physiologic apical thinning.  No evidence for   ischemia or infarction.    Read By:  [place] Bing,  M.D.   Released By:  H[place] Bing,  M.D.  (08/02/2007 16:51)      Nuclear Study  Procedure date:  08/02/2007  Findings:       Clinical data:   65 year old woman with dyspnea and a history of mild   coronary artery disease.   ADENOSINE STRESS MYOVIEW:   RADIONUCLIDE DATA:  Two day stress/rest protocol performed with   30.0/20.0 mCi Tc66m Myoview.   STRESS DATA:  Adenosine infusion  resulted in malaise and chest   tightness.  There was a modest increase in heart rate and decrease in   systolic blood pressure with drug administration.  No arrhythmias   noted.   EKG:  Atrial synchronous ventricular pacing.   SCINTIGRAPHIC DATA:  Acquisition notable for moderate breast   attenuation resulting in decreased image quality.  There was very   mild left ventricular dilatation.  On tomographic images   reconstructed in standard planes, distribution of tracer was   generally patchy.  There was a small apical defect of borderline   numeric significance; no reversibility was seen in this region.  The   gated reconstruction revealed normal regional and global LV systolic   function as well as normal systolic accentuation of activity   throughout.  Estimated ejection fraction was .55.   IMPRESSION:   Negative pharmacologic stress Myoview study revealing an   uninterpretable EKG due to the presence of right ventricular pacing,   very mild left ventricular dilatation, normal left ventricular   function and prominent physiologic apical thinning.  No evidence for   ischemia or infarction.    Read By:  Bena Bing,  M.D.   Released By:  [place] Bing,  M.D.

## 2010-10-21 NOTE — Cardiovascular Report (Signed)
Summary: Office Visit   Office Visit   Imported By: Roderic Ovens 02/04/2010 11:13:07  _____________________________________________________________________  External Attachment:    Type:   Image     Comment:   External Document

## 2010-10-21 NOTE — Miscellaneous (Signed)
Summary: LABS CMP,LIPIDS,A1C 06/12/2009  Clinical Lists Changes  Observations: Added new observation of ALBUMIN: 4.3 g/dL (59/56/3875 64:33) Added new observation of PROTEIN, TOT: 7.6 g/dL (29/51/8841 66:06) Added new observation of SGPT (ALT): 15 units/L (06/12/2009 10:11) Added new observation of SGOT (AST): 16 units/L (06/12/2009 10:11) Added new observation of ALK PHOS: 126 units/L (06/12/2009 10:11) Added new observation of CREATININE: 1.18 mg/dL (30/16/0109 32:35) Added new observation of BUN: 28 mg/dL (57/32/2025 42:70) Added new observation of BG RANDOM: 117 mg/dL (62/37/6283 15:17) Added new observation of CO2 PLSM/SER: 27 meq/L (06/12/2009 10:11) Added new observation of CL SERUM: 102 meq/L (06/12/2009 10:11) Added new observation of K SERUM: 3.5 meq/L (06/12/2009 10:11) Added new observation of NA: 143 meq/L (06/12/2009 10:11) Added new observation of LDL: 153 mg/dL (61/60/7371 06:26) Added new observation of HDL: 37 mg/dL (94/85/4627 03:50) Added new observation of TRIGLYC TOT: 303 mg/dL (09/38/1829 93:71) Added new observation of CHOLESTEROL: 251 mg/dL (69/67/8938 10:17) Added new observation of HGBA1C: 8.3 % (06/12/2009 10:11)

## 2010-10-21 NOTE — Letter (Signed)
Summary: Omar Results Engineer, agricultural at [facility]  [address] [place] [address]   Phone: [phone]  Fax: [phone]      December 19, 2009 MRN: [id number]   [facility] [address] [place], [place]  [address]   Dear Ms. [facility],  Your test ordered by Selena Batten has been reviewed by your physician (or physician assistant) and was found to be normal or stable. Your physician (or physician assistant) felt no changes were needed at this time.  ____ Echocardiogram  ____ Cardiac Stress Test  _x___ Lab Work  ____ Peripheral vascular study of arms, legs or neck  __x__ CT scan or X-ray  ____ Lung or Breathing test  ____ Other:  No change in medical treatment at this time, per Dr. Dietrich Pates.  Enclosed is a copy of your labwork for your records.  Thank you, Edan Serratore Allyne Gee RN    [place] Bing, MD, Lenise Arena.C.Gaylord Shih, MD, F.A.C.C Lewayne Bunting, MD, F.A.C.C Nona Dell, MD, F.A.C.C Charlton Haws, MD, Lenise Arena.C.C

## 2010-10-21 NOTE — Letter (Signed)
Summary: Appointment - Missed  [facility] [place]  [address] [place] [address]   Phone: [phone]  Fax: [phone]     August 15, 2010 MRN: [id number]   [facility] [address] [place], [place]  [address]   Dear Ms. [facility],  Our records indicate you missed your appointment on      08/15/10                  with Dr.   Dietrich Pates    .                                    It is very important that we reach you to reschedule this appointment. We look forward to participating in your health care needs. Please contact us at the number listed above at your earliest convenience to reschedule this appointment.     Sincerely,    Glass blower/designer

## 2010-10-21 NOTE — Miscellaneous (Signed)
Summary: LABS BMP,LIPIDS,03/21/2010  Clinical Lists Changes  Observations: Added new observation of CALCIUM: 9.4 mg/dL (11/91/4782 9:56) Added new observation of CREATININE: 1.22 mg/dL (21/30/8657 8:46) Added new observation of BUN: 23 mg/dL (96/29/5284 1:32) Added new observation of BG RANDOM: 271 mg/dL (44/09/270 5:36) Added new observation of CO2 PLSM/SER: 27 meq/L (03/21/2010 8:21) Added new observation of CL SERUM: 98 meq/L (03/21/2010 8:21) Added new observation of K SERUM: 3.7 meq/L (03/21/2010 8:21) Added new observation of NA: 137 meq/L (03/21/2010 8:21) Added new observation of LDL: 138 mg/dL (64/40/3474 2:59) Added new observation of HDL: 30 mg/dL (56/38/7564 3:32) Added new observation of TRIGLYC TOT: 303 mg/dL (95/18/8416 6:06) Added new observation of CHOLESTEROL: 229 mg/dL (30/16/0109 3:23)

## 2010-10-21 NOTE — Letter (Signed)
Summary: bp readings  bp readings   Imported By: Faythe Ghee 03/26/2010 15:56:49  _____________________________________________________________________  External Attachment:    Type:   Image     Comment:   External Document

## 2010-10-23 NOTE — Miscellaneous (Signed)
Summary: Device preload  Clinical Lists Changes  Observations: Added new observation of PPM INDICATN: CHB (10/10/2010 21:24) Added new observation of MAGNET RTE: BOL 85 ERI 65 (10/10/2010 21:24) Added new observation of PPMLEADSTAT2: active (10/10/2010 21:24) Added new observation of PPMLEADSER2: [id number] V (10/10/2010 21:24) Added new observation of PPMLEADMOD2: 5076  (10/10/2010 21:24) Added new observation of PPMLEADDOI2: 11/11/2004  (10/10/2010 21:24) Added new observation of PPMLEADLOC2: RV  (10/10/2010 21:24) Added new observation of PPMLEADSTAT1: active  (10/10/2010 21:24) Added new observation of PPMLEADSER1: [id number]  (10/10/2010 21:24) Added new observation of PPMLEADMOD1: 5076  (10/10/2010 21:24) Added new observation of PPMLEADDOI1: 11/11/2004  (10/10/2010 21:24) Added new observation of PPMLEADLOC1: RA  (10/10/2010 21:24) Added new observation of PPM IMP MD: Lewayne Bunting, MD  (10/10/2010 21:24) Added new observation of PPM DOI: 11/11/2004  (10/10/2010 21:24) Added new observation of PPM SERL#: [id number]  (10/10/2010 21:24) Added new observation of PPM MODL#: 303B  (10/10/2010 21:24) Added new observation of PACEMAKERMFG: Medtronic  (10/10/2010 21:24) Added new observation of PACEMAKER MD: Lewayne Bunting, MD  (10/10/2010 21:24)      PPM Specifications Following MD:  Lewayne Bunting, MD     PPM Vendor:  Medtronic     PPM Model Number:  303B     PPM Serial Number:  [id number] PPM DOI:  11/11/2004     PPM Implanting MD:  Lewayne Bunting, MD  Lead 1    Location: RA     DOI: 11/11/2004     Model #: [id number]     Serial #: [id number]     Status: active Lead 2    Location: RV     DOI: 11/11/2004     Model #: [id number]     Serial #: [id number]     Status: active  Magnet Response Rate:  BOL 85 ERI 65  Indications:  CHB   PPM Follow Up Pacer Dependent:  Yes      Episodes Coumadin:  No  Parameters Mode:  DDD     Lower Rate Limit:  60     Upper Rate Limit:  120 Paced AV Delay:  150      Sensed AV Delay:  120

## 2010-10-23 NOTE — Progress Notes (Signed)
**Note De-Identified Forbes Loll Obfuscation** Summary: Refill   Phone Note Call from Patient   Caller: Patient Reason for Call: Refill Medication Summary of Call: pt needs Diltiazem 120mg  called to [facility] / tg Initial call taken by: Raechel Ache [facility],  September 26, 2010 3:19 PM  Follow-up for Phone Call        Pt. no showed f/u with Dr. Dietrich Pates in November and has not rescheduled, home phone is disconnected and left message on work number. Will refill a 30 day supply and ask  pharmacy to advise pt. she must call this office to continue refills. Follow-up by: Larita Fife Kamri Gotsch LPN,  September 26, 2010 4:02 PM    Prescriptions: DILTIAZEM HCL ER BEADS 120 MG XR24H-CAP (DILTIAZEM HCL ER BEADS) Take one capsule by mouth daily  #30 x 0   Entered by:   Larita Fife Arya Luttrull LPN   Authorized by:   Kathlen Brunswick, MD, [facility]   Signed by:   Larita Fife Anola Mcgough LPN on 60/45/4098   Method used:   Electronically to        [facility] (retail)       [address] [place], [place]  [address]       Ph: [phone]       Fax: [phone]   RxID:   [id number]

## 2010-11-23 DIAGNOSIS — I509 Heart failure, unspecified: Secondary | ICD-10-CM

## 2010-11-23 DIAGNOSIS — I2510 Atherosclerotic heart disease of native coronary artery without angina pectoris: Secondary | ICD-10-CM | POA: Insufficient documentation

## 2010-12-09 LAB — BLOOD GAS, ARTERIAL
Acid-base deficit: 1.2 mmol/L (ref 0.0–2.0)
TCO2: 21.2 mmol/L (ref 0–100)
pCO2 arterial: 41.6 mmHg (ref 35.0–45.0)
pO2, Arterial: 64.4 mmHg — ABNORMAL LOW (ref 80.0–100.0)

## 2010-12-12 ENCOUNTER — Encounter: Payer: Self-pay | Admitting: Internal Medicine

## 2010-12-19 ENCOUNTER — Encounter: Payer: Self-pay | Admitting: Cardiology

## 2010-12-19 ENCOUNTER — Ambulatory Visit (INDEPENDENT_AMBULATORY_CARE_PROVIDER_SITE_OTHER): Payer: Medicare HMO | Admitting: Cardiology

## 2010-12-19 DIAGNOSIS — M545 Low back pain: Secondary | ICD-10-CM

## 2010-12-19 DIAGNOSIS — I10 Essential (primary) hypertension: Secondary | ICD-10-CM

## 2010-12-19 DIAGNOSIS — I2510 Atherosclerotic heart disease of native coronary artery without angina pectoris: Secondary | ICD-10-CM

## 2010-12-19 DIAGNOSIS — E785 Hyperlipidemia, unspecified: Secondary | ICD-10-CM

## 2010-12-19 MED ORDER — DILTIAZEM HCL ER COATED BEADS 240 MG PO TB24: 240.0000 mg | ORAL_TABLET | Freq: Every day | ORAL | Status: DC

## 2010-12-19 MED ORDER — METOPROLOL TARTRATE 100 MG PO TABS: 100.0000 mg | ORAL_TABLET | Freq: Two times a day (BID) | ORAL | Status: DC

## 2010-12-19 NOTE — Patient Instructions (Signed)
Your physician recommends that you schedule a follow-up appointment in: 6 months  Your physician has recommended you make the following change in your medication: increase metoprolol tartrate to 100mg  twice daily, diltiazem to 240mg  daily  Your physician recommends that you return for lab work [id number]   Your physician discussed the importance of regular exercise and recommended that you start or continue a regular exercise program for good health.   Your physician encouraged you to lose weight for better health.  Your physician discussed the importance of regular exercise and recommended that you start or continue a regular exercise program for good health.  Your physician has requested that you regularly monitor and record your blood pressure readings at home. Please use the same machine at the same time of day to check your readings and record them to bring to your follow-up visit. And bring to nurse visit

## 2010-12-19 NOTE — Assessment & Plan Note (Signed)
Blood pressure control is suboptimal.  Her dose of metoprolol will be increased to 100 mg b.i.d. And diltiazem to 240 mg q.d.  Since she has a pacemaker, there is no major concern about inducing bradycardia.  She will monitor her blood pressure at home and return in one month for reassessment by one of the cardiology nurses.

## 2010-12-19 NOTE — Assessment & Plan Note (Signed)
Clinically insignificant coronary disease in the past with no current evidence for progression.  Optimal control of cardiovascular risk factors is warranted.

## 2010-12-19 NOTE — Progress Notes (Signed)
HPI : Ms. Vonseggern returns to the office as scheduled for continued assessment and treatment of hypertension, hyperlipidemia and exertional dyspnea.  As a result of orthopaedic problems, primarily back pain, she walks with a cane or walker, but has vowed to gradually increase her level of exercise.  Obesity has been a continuing problem.  Blood pressure control has been suboptimal as far she knows with systolics approximating 160.  She has had no chest pain, orthopnea, PND or syncope.  She developed increasing dyspnea recently, but this is improved with an increase in her dose of diuretics.  Current Outpatient Prescriptions on File Prior to Visit  Medication Sig Dispense Refill  . allopurinol (ZYLOPRIM) 100 MG tablet Take 100 mg by mouth daily.        [name] diltiazem (CARDIZEM) 120 MG tablet 120 mg daily.       . fish oil-omega-3 fatty acids 1000 MG capsule Take 1,000 mg by mouth 2 (two) times daily.        . furosemide (LASIX) 20 MG tablet Take 20 mg by mouth 2 (two) times daily.       . insulin NPH-insulin regular (NOVOLIN 70/30) (70-30) 100 UNIT/ML injection Inject into the skin. 60 units in am and 15 units in pm       . omeprazole (PRILOSEC) 20 MG capsule Take 20 mg by mouth 2 (two) times daily.        [name] DISCONTD: metoprolol (LOPRESSOR) 50 MG tablet Take 50 mg by mouth 2 (two) times daily.        [name] diltiazem (CARDIZEM LA) 240 MG 24 hr tablet Take 1 tablet (240 mg total) by mouth daily.  30 tablet  6  . Multiple Vitamin (MULTIVITAMIN) capsule Take 1 capsule by mouth daily.        [name] DISCONTD: cloNIDine (CATAPRES) 0.3 MG tablet 0.3 mg. Once weekly       . DISCONTD: Olmesartan-Amlodipine-HCTZ (TRIBENZOR) 40-10-12.5 MG TABS Take by mouth daily.       [name] DISCONTD: pravastatin (PRAVACHOL) 80 MG tablet Take 80 mg by mouth daily.          Allergies  Allergen Reactions  . Simvastatin   . Verapamil       Past medical history, social history, and family history reviewed and updated.  ROS:  See history of  present illness.  PHYSICAL EXAM: BP 145/71  Pulse 60  Ht 5\' 4"  (1.626 m)  Wt 234 lb (106.142 kg)  BMI 40.17 kg/m2  SpO2 97%  General-Well developed; no acute distress Body habitus-obese Neck-No JVD; no carotid bruits Lungs-clear lung fields; resonant to percussion Cardiovascular-normal PMI; normal S1 and S2; modest early systolic ejection murmur Abdomen-normal bowel sounds; soft and non-tender without masses or organomegaly Musculoskeletal-No deformities, no cyanosis or clubbing Neurologic-Normal cranial nerves; symmetric strength and tone Skin-Warm, no significant lesions Extremities-distal pulses intact; trace edema  ASSESSMENT AND PLAN:

## 2010-12-19 NOTE — Assessment & Plan Note (Addendum)
Most recent lipid profile approximately 7 months ago was extremely suboptimal.  It is unclear whether she was taking pravastatin at that time.  She subsequently developed pruritus prompting substitution of rosuvastatin 10 mg q.d.  While I doubt that dose will be adequate, a lipid profile will be reassessed and dosage adjusted.  I fear that Alyssa [name] may not tolerate any of the statins.

## 2010-12-23 ENCOUNTER — Encounter: Payer: Self-pay | Admitting: Internal Medicine

## 2010-12-24 ENCOUNTER — Encounter: Payer: Self-pay | Admitting: Cardiology

## 2010-12-26 LAB — GLUCOSE, CAPILLARY: Glucose-Capillary: 130 mg/dL — ABNORMAL HIGH (ref 70–99)

## 2011-01-05 ENCOUNTER — Other Ambulatory Visit: Payer: Self-pay | Admitting: Cardiology

## 2011-01-06 LAB — COMPREHENSIVE METABOLIC PANEL
Albumin: 4.6 g/dL (ref 3.5–5.2)
CO2: 28 mEq/L (ref 19–32)
Glucose, Bld: 340 mg/dL — ABNORMAL HIGH (ref 70–99)
Potassium: 4 mEq/L (ref 3.5–5.3)
Sodium: 138 mEq/L (ref 135–145)
Total Protein: 8.1 g/dL (ref 6.0–8.3)

## 2011-01-06 LAB — LIPID PANEL
Cholesterol: 157 mg/dL (ref 0–200)
VLDL: 38 mg/dL (ref 0–40)

## 2011-01-08 ENCOUNTER — Encounter: Payer: Self-pay | Admitting: Cardiology

## 2011-01-11 ENCOUNTER — Encounter: Payer: Self-pay | Admitting: *Deleted

## 2011-01-11 ENCOUNTER — Telehealth: Payer: Self-pay | Admitting: *Deleted

## 2011-01-11 DIAGNOSIS — I10 Essential (primary) hypertension: Secondary | ICD-10-CM

## 2011-01-11 NOTE — Telephone Encounter (Signed)
Result letter mailed to pt, new labs for may ordered

## 2011-02-03 NOTE — Assessment & Plan Note (Signed)
HEALTHCARE                       Alyssa [name] CARDIOLOGY OFFICE NOTE   [name], Alyssa Alyssa [name]                       MRN:          [id number]  DATE:04/18/2007                            DOB:          12/20/1945    IDENTIFICATION:  Alyssa [name] is a 65 year old woman with a history of  minimal CAD by catheterization in 2007, status post pacemaker placement  (Medtronic) for tachy-brady, hypertension, dyslipidemia, obesity.  She  was last seen in the cardiology clinic back in August of last year.   In the interval, she says she has been more short of breath lately.  She  says she feels congested, hoarse.  She does note that, when she lays  flat at night, she gets a lot of acid taste in her mouth.  She has to  sleep propped up.   She is fatigued all the time.  She is not sleeping well.  Has not slept  for a few weeks through the whole night.   Denies chest pain.   CURRENT MEDICATIONS:  From looking at her bag:  1. Aspirin 81 mg daily.  2. Humalog as directed.  3. Clonidine 0.3 b.i.d.  4. Potassium 20 mEq daily.  5. Lisinopril 20/12 daily.  6. Lovastatin 20 daily.  7. Omeprazole 20 b.i.d.  8. Lasix 80 daily.  9. Ex-forge 10/160 daily.  10.Benicar hematocrit 40/25 daily.  11.Xyzal 5 every other day.   PHYSICAL EXAM:  On exam the patient is in no distress.  Blood pressure 150/70, pulse is 72 and regular.  NECK:  JVP normal.  No bruits.  LUNGS:  Clear.  No wheezes or rales.  CARDIAC:  Regular rate and rhythm.  S1, S2.  No S3.  No significant  murmurs.  ABDOMEN:  Obese, benign.  EXTREMITIES:  2+ pulses.  1+ ankle edema.   IMPRESSION:  1. Shortness of breath.  I am not convinced it is cardiac.  She has      normal left ventricular function.  It may be related to her weight.      She also has evidence of reflux and, with the congestion, I wonder      if she is having more than she knows of with some vocal cord      instability.  What I have recommended  is that she:      a.     Stop the lisinopril.  She is on Benicar.      b.     After discussion with Dr. Loleta [name], will refer to Dr. Jena [name] for       full GI evaluation.  Continue on current medicines for now.  2. Hypertension.  Confusion on this.  Dr. Loleta [name], I just spoke with on      the phone, thinks she is on Toprol 100 b.i.d.  Did not know she was      on lisinopril.  I would add back the metoprolol to 50 b.i.d. and      follow up in about 8 weeks.  I think her heart rate will tolerate      this.  Continue  other meds for now.  3. Coronary artery disease.  I am not convinced there is anything      active.  Would continue on current regimen.  4. Dyslipidemia.  Will need of follow up lipid panel at her      convenience.   Again, follow up in 2 months, sooner if problems develop.     Alyssa Riffle, MD, [facility]  Electronically Signed    PVR/MedQ  DD: 04/18/2007  DT: 04/19/2007  Job #: [id number]   cc:   Alyssa [name]. Alyssa Chance, MD  Alyssa [name], M.D.

## 2011-02-03 NOTE — Assessment & Plan Note (Signed)
[facility]                         ELECTROPHYSIOLOGY OFFICE NOTE   NAME:Blaszczyk, Alyssa [name]                       MRN:          [id number]  DATE:08/04/2007                            DOB:          02/27/1946    [name] was seen in the [facility] today on August 04, 2007,  for followup of her Medtronic model #303B Sigma.  Date of implant was  November 11, 2004, for complete heart block.   On interrogation of her device today, her battery voltage is 2.79.  P  waves measured greater than 2.8 millivolts with an atrial capture  threshold of 0.5 volts at 0.4 milliseconds and an atrial lead impedance  at 451 ohms.  R waves are not measured.  She is pacemaker dependent  today to a rate of 30 with a ventricular pacing threshold of 0.5 volts  at 0.4 milliseconds and a ventricular lead impedance of 481 ohms.  Atrial A pacing 47.3% of the time and ventricularly pacing 99.9% of the  time.  There were 155 mode switch episodes noted today, totally 0.3% of  the time.  The longest episode was in October that lasted an hour and 12  minutes.  She is not on Coumadin therapy.  There were 2 ventricular high  rate episodes noted also, they were only 0.03 and 0.04 seconds long.  No  changes were made in her parameters today.   She had a stress test earlier this week for some chest discomfort she  has been having and will follow up with Dr. Tenny Craw with that.   She will continue with her phone checks with a return office visit in 6  months' time.      Alyssa Harm, LPN  Electronically Signed      Doylene Canning. Ladona Ridgel, MD  Electronically Signed   PO/MedQ  DD: 08/04/2007  DT: 08/04/2007  Job #: [id number]

## 2011-02-03 NOTE — Assessment & Plan Note (Signed)
[facility]                       [facility] OFFICE NOTE   NAME:Alyssa [name], Alyssa [name]                       MRN:          [id number]  DATE:07/22/2007                            DOB:          10/13/1945    IDENTIFICATION:  The patient is a 65 year old woman with a history of  minimal CAD, pacer placement for tachy-brady, hypertension,  dyslipidemia, obesity.  She was last seen in July.   When she was seen, she stopped lisinopril and I switched her to Benicar.  She complained of shortness of breath which I felt to be related to her  weight and reflux.  I also recommended referral to Dr. Jena Gauss.  I added  back metoprolol to 50 mg b.i.d., recommended follow-up in eight weeks.   Since seen, she says she just feels tired in her chest.  Her heart  rate feels slow at times, but then picks back up.  Overall she just says  she is tired.  She does not do much.  She has had occasional sharp  chest pains, but nothing with exertion.  She says the pains are near her  pacemaker site.   CURRENT MEDICATIONS:  1. Aspirin 81 mg.  2. Humalog 75/25 60 in the a.m. and 15 at night.  3. Clonidine 0.3.  4. Potassium 20 mEq.  5. Lovastatin 20 mg.  6. Omeprazole 20 mg.  7. Lasix 120 mg.  8. Exforge 10/160.  9. Benicar/HCT 40/25.  10.Xyzal every other day.  11.Metoprolol 50 mg b.i.d.   PHYSICAL EXAMINATION:  GENERAL:  The patient is in no distress.  VITAL SIGNS:  Blood pressure 130/70, pulse 68, weight 212.  LUNGS:  Clear without wheezes or rales.  HEART:  Regular rate and rhythm.  S1 and S2.  No S3.  ABDOMEN:  Benign.  EXTREMITIES:  No edema.   12-lead EKG shows sinus rhythm, dual chamber pacemaker, PAC's.   IMPRESSION:  Fatigue.  I am not sure what this represents.  I would set  the patient up though for a Myoview to evaluate.  Note, her EKG shows  evidence of atrial ectopy and dual chamber pacing.  I would continue on  current regimen for now.     Pricilla Riffle, MD, [facility]  Electronically Signed    PVR/MedQ  DD: 07/25/2007  DT: 07/25/2007  Job #: [id number]   cc:   Annia Friendly. Loleta Chance, MD

## 2011-02-03 NOTE — Letter (Signed)
January 13, 2008    Sharolyn Douglas, M.D.  [address]  [place], [place] [address]   RE:  Alyssa [name], Alyssa [name]  MRN:  [id number]  /  DOB:  06/07/1946   Dear Dr. Noel Gerold,   This is a letter regarding [name].  She is a patient I follow in  cardiology clinic.  She has a history of minimal CAD, tachybrady  syndrome (status post pacer placement), hypertension, dyslipidemia and  obesity.  I last saw her back in October.   Since seen, she actually says she is doing fairly well from a cardiac  standpoint.  She denies chest pain.  Her breathing is okay for the  activities she does.  Her activities are limited more by her back.   Note she had a cardiac catheterization in 2004 that showed minimal CAD.  She also had a Myoview study back in November of 2008 that showed no  evidence for ischemia or infarct.  LVEF was 55%.   CURRENT MEDICINES:  Include Humalog 75/25 60 units in the a.m. and 15  units p.m., clonidine 0.3 b.i.d., potassium 20, lovastatin 20 q.h.s.,  omeprazole 20, Lasix 120 daily, Exforge 10/160 daily, Benicar HCTZ 40/25  daily, Xyzal every other day and metoprolol 50 b.i.d.   PHYSICAL EXAM:  The patient is in no distress.  Blood pressure 130/82, pulse was 60, weight 218.  The patient, again, was without shortness of breath or chest pain.  Her lungs are relatively clear.  No rales or wheezes.  CARDIAC EXAM:  Regular rate and rhythm. S1, S2.  No S3.  Abdomen is benign, obese.  EXTREMITIES:  No edema.   A 12-lead EKG done today showed dual-chamber pacing.  The rate is 60  beats per minute.   IMPRESSION:  From a cardiac standpoint, I think the patient will do well  with surgery.  She has no significant coronary artery disease, no  ischemia on recent Myoview.  She does have a pacemaker and I think  during the procedure, a magnet can be placed and then removed once the  procedure is done.  The pacemaker reps can be contacted to do this  (Medtronic).   I would continue the patient  on the same medicines and I plan on seeing  her in the fall.  If you have any problems or concerns, please contact  our service in the perioperative period.    Sincerely,      Pricilla Riffle, MD, [facility]  Electronically Signed    PVR/MedQ  DD: 01/13/2008  DT: 01/13/2008  Job #: [id number]   CC:    Annia Friendly. Loleta Chance, MD

## 2011-02-06 NOTE — Discharge Summary (Signed)
[name], [name] NO.:  [id number]   MEDICAL RECORD NO.:  [id number]          PATIENT TYPE:  INP   LOCATION:  2019                         FACILITY:  [facility]   PHYSICIAN:  Arvilla Meres, M.D. LHCDATE OF BIRTH:  Feb 17, 1946   DATE OF ADMISSION:  11/10/2004  DATE OF DISCHARGE:  11/13/2004                           DISCHARGE SUMMARY - REFERRING   PROCEDURES:  1.  Medtronic pacemaker implantation November 11, 2004.  2.  A 2D echocardiogram.   REASON FOR ADMISSION:  Alyssa [name] is a 65 year old female with a history of  insulin-requiring diabetes mellitus, hypertension and dyslipidemia, but no  know coronary artery disease, followed by Dr. Vida Roller in [place],  who presented from [facility] with symptomatic bradycardia in the  setting of complete heart block and hypertensive crisis.  Please refer to  admission note for full details.   LABORATORY DATA:  CPK 100/1.3, troponin I 0.05, BNP 654.  Lipid profile:  Total cholesterol 121, triglyceride 101, HDL 31, LDL 70 (cholesterol/ratio  3.9), TSH 2.55, free T4 1.07, hemoglobin A1C 8.1.  Sodium 140, potassium  3.8, glucose 230, BUN 23, creatinine 1.4, on admission decreased albumin  3.1, otherwise, normal liver enzymes.   CHEST X-RAY:  On admission, no acute abnormalities.   HOSPITAL COURSE:  Following transfer from [facility], the patient  was admitted by Dr. Gala Romney and medications were adjusted with  discontinuation of all AV nodal blocking agents (Tarka, Toprol), as well as  Clonidine.  The patient presented with a blood pressure of 230/50 following  transfer.  She was also started on hydralazine at 25 q.8h. which was  subsequently titrated to 100 t.i.d. by Dr. Dietrich Pates prior to discharge.   Antihypertensive medication regimen was made by Dr. Dietrich Pates, the following  day, with the addition of Norvasc and Micardis and up titration of  hydralazine.   A 2D echocardiogram (55-65%) with  no significant valvular abnormalities.   The patient was referred to Dr. Lewayne Bunting for consideration of pacemaker  implantation, and on February 21, underwent successful placement of a  Medtronic pacemaker for treatment of symptomatic complete heart block, with  no noted complications.   The patient was clear for discharge, following stabilization of blood  pressure to a discharge level of 121/65.   There were no noted complications of the pacemaker wound site; post-  procedure chest x-ray was negative.   DISCHARGE MEDICATIONS:  1.  Coated aspirin 81 mg daily.  2.  Lasix 80 mg daily.  3.  K-Dur 20 mEq daily.  4.  Actos 30 mg daily.  5.  Vytorin 10/80 mg daily.  6.  Micardis 80 mg daily.  7.  Norvasc 10 mg daily.  8.  Prilosec 20 mg bid  9.  Toprol XL 100 mg daily.  10. Clonidine 0.2 mg daily.  11. Insulin 70/30.   FOLLOWUP:  1.  I will follow post-pacemaker instruction sheet as outlined.  2.  Follow up with [facility] on Wednesday, March 8 at 9:45      a.m.  3.  Follow up with Dr. Sharlot Gowda  Ladona Ridgel, May 2006.  Arrangements to be made      through our office.  4.  Follow up with Dr. Vida Roller on Thursday, March 9, at 1:30 p.m.  5.  Follow up with Dr. Mirna Mires for management of diabetes mellitus.   DISCHARGE DIAGNOSES:  1.  Symptomatic bradycardia.      1.  Associated complete heart block.      2.  Status post Medtronic pacemaker implantation November 11, 2004.  2.  Dyslipidemia.  3.  History of left nephrectomy.      GS/MEDQ  D:  11/13/2004  T:  11/13/2004  Job:  [id number]   cc:   Annia Friendly. Loleta Chance, MD  [address]  [place]  [place] [address]  Fax: [phone]

## 2011-02-06 NOTE — Cardiovascular Report (Signed)
Alyssa [name], Alyssa [name] NO.:  [id number]   MEDICAL RECORD NO.:  [id number]          PATIENT TYPE:  OIB   LOCATION:  1963                         FACILITY:  [facility]   PHYSICIAN:  Charlton Haws, M.D.     DATE OF BIRTH:  July 21, 1946   DATE OF PROCEDURE:  DATE OF DISCHARGE:                              CARDIAC CATHETERIZATION   INDICATIONS:  A 65 year old patient with diabetes,  abnormal Myoview, chest  pain.   Catheterization was somewhat difficult due to the patient's pannus and  excessive weight.  We were able to enter the right femoral artery after  multiple attempts using a 5-French sheath.   Left main coronary artery had 20% discrete stenosis.   Left anterior descending artery had 40% tubular disease proximally. The  first diagonal branch had 30% discrete lesions.   Distal LAD was normal. The ostium of the circumflex coronary artery had a  30% discrete lesion.  Mid and distal circ and OM were normal.   Right coronary artery was large.  Proximal mid and distal vessel was normal.  There were 30% mild discrete lesions in the PDA and PLA.   The patient only has one kidney and her creatinine was 1.4. She was hydrated  prior to the case. We did not do an LV gram.  Forty-five cc of contrast was  used   The patient will be discharged later today so long as her groin heals well.  She will follow up at [facility] in Dr. Marchelle Folks office for a  creatinine on Wednesday.           ______________________________  Charlton Haws, M.D.     PN/MEDQ  D:  09/28/2005  T:  09/28/2005  Job:  [id number]   cc:   Vida Roller, M.D.  Fax: [phone]   Annia Friendly. Loleta Chance, MD  Fax: [phone]

## 2011-02-06 NOTE — Op Note (Signed)
[facility]  Patient:    Alyssa [name], Alyssa [name] Visit Number: [id number] MRN: [id number]          Service Type: DSU Location: DAY Attending Physician:  Windle Guard Dictated by:   Darreld Mclean, M.D. Proc. Date: 03/21/02 Admit Date:  03/21/2002 Discharge Date: 03/21/2002                             Operative Report  PREOPERATIVE DIAGNOSIS:  Tear of lateral meniscus, left knee.  POSTOPERATIVE DIAGNOSIS: Tear of lateral meniscus, left knee.  OPERATION: 1. Operative arthroscopy, left knee. 2. Partial lateral meniscectomy.  SURGEON:  Darreld Mclean, M.D.  ASSISTANT:  Manson Passey, P.A.C.  ANESTHESIA:  Spinal.  TOURNIQUET TIME:  27 minutes.  DRAINS:  None.  INDICATIONS:  The patient is a 65 year old female with pain and tenderness in the left knee.  MRI shows tear of the lateral meniscus, left knee.  She has had pain, giving away, and not improved with conservative treatment, rest, or ambulatory devices.  Surgery was recommended.  Risks and imponderables have been discussed preoperatively.  OPERATIVE FINDINGS:  Patella pouch had mild degenerative changes undersurface of patella, mild synovitis.  Medially, there were no meniscal tears, grade II changes on the reticular surfaces, anterior cruciate intact laterally, diffuse tear of the anterior to most lateral portion of the lateral meniscus with grade II to II changes reticular surface, no loose bodies.  DESCRIPTION OF PROCEDURE:  The patient was given spinal anesthesia and placed supine on the operating room table.  Tourniquet placed deflated on the thigh. The patient was prepped and draped in the usual manner.  The leg was elevated and wrapped circumferentially with Esmarch bandage.  Before the procedure began, we confirmed that the left leg was the correct side, and we reassessed and restated the purpose of the procedure.  Incision was then made. Medial portal was made, and lactated Ringers instilled  infusion pump.  All scopes inserted laterally and knee systematically examined.  Please see Findings above.  Attention was directed to the lateral side.  Using meniscal shaver and meniscal punch, good contour was obtained.  The meniscus was removed.  Knee was systematically reexamined, and no new pathology found.  The knee was irrigated with lactated Ringers.  Permanent pictures taken throughout the procedure.  Wound reapproximated using 3-0 nylon vertical mattress manner. Marcaine 0.25% instilled in each portal.  Tourniquet deflated at 27 minutes. Sterile dressing applied, bulky dressing applied.  The patient tolerated the procedure well and went to recovery in good condition.  Appropriate analgesia given for pain.  I will see the patient in the office in approximately 10 days to two weeks.  Physical therapy has been arranged.   For any difficulties, contact me through the hospital beeper system. Dictated by:   Darreld Mclean, M.D. Attending Physician:  Windle Guard DD:  03/21/02 TD:  03/22/02 Job: [id number]

## 2011-02-06 NOTE — H&P (Signed)
NAMESAFIRA, PROFFIT NO.:  [id number]   MEDICAL RECORD NO.:  [id number]          PATIENT TYPE:  INP   LOCATION:  2116                         FACILITY:  [facility]   PHYSICIAN:  Arvilla Meres, M.D. [facility] OF BIRTH:  12/23/1945   DATE OF ADMISSION:  11/10/2004  DATE OF DISCHARGE:                                HISTORY & PHYSICAL   PRIMARY CARE PHYSICIAN:  Dr. Mirna Mires, [facility]   CARDIOLOGIST:  Dr. Vida Roller of [facility]   PATIENT IDENTIFICATION:  Alyssa [name] is a delightful 65 year old woman with a  history of poorly controlled hypertension who was transferred from [facility] with a complete heart block and symptomatic bradycardia in the  setting of a hypertensive crisis.   She has a history of chest pain and had a negative Cardiac catheterization  in September of 2004.  Also has a history of renal cell carcinoma and she is  status post left nephrectomy.   Over the past week she has experienced profound fatigue to the point where  she finds it difficult to complete her activities of daily living. She also  has noted increasing orthopnea from 2 to 3 pillows. She denies any chest  pain.  She has had occasional light headedness but no presyncope or syncope.  She says she takes her blood pressure on a daily basis and has noted  systolic blood pressures in about the 140 to 170 range over the past week.  However her heart rates have been in the 30 to 40 range.   She went to see her primary care physician today and was found to be in a  complete heart block.  At the time she had heart rates between 35 and 38  beats per minute with systolic blood pressures in the 200 to 220 range.  She  was admitted to [facility] and transferred to [facility]  for further evaluation and treatment.   Currently she feels fatigued but denies any shortness of breath or  dizziness. She has not had any new medications or changes in her medication  regimen.  She reports a history of slow heart rate in the past but cannot  give further details.   PAST MEDICAL HISTORY:  1.  Chest pain status post negative cardiac catheterization in September of      2004.  2.  Labile hypertension on at least six antihypertensive medications.  3.  Renal cell carcinoma status post left nephrectomy.  4.  Obesity.  5.  Congestive heart failure secondary to diastolic dysfunction.  6.  Diabetes.  7.  Hyperlipidemia.   CURRENT MEDICATIONS:  1.  Tarka 4/249 b.i.d.  2.  Clonidine 0.2 mg b.i.d.  3.  Micardis-HCT 20 mg/12.5 mg daily.  4.  Vytorin 10/80 daily.  5.  Lasix 80 mg p.o. daily.  6.  K-Dur 10 mEq daily.  7.  Toprol XL 200 mg p.o. b.i.d.  8.  Actos 30 mg daily.  9.  Humulin 70/30 reportedly 60 units q.a.m. but she titrates this to her      morning  blood sugars, and 50 units SQ q.p.m.  10. Hydrocodone/APAP 7.5/750 q.6h p.r.n.  11. Prilosec 20 mg b.i.d.   SOCIAL HISTORY:  She is widowed. She has two children. She lives in  [place]. She is disabled. No alcohol or tobacco currently.   FAMILY HISTORY:  Positive for CAD.   PHYSICAL EXAMINATION:  GENERAL:  She is fatigued but otherwise in no acute  distress.  VITAL SIGNS:  She is afebrile, blood pressure is 230/50 with a heart rate of  37, respirations are 16. She is saturating 100% on 2 liters nasal cannula.  HEENT:  Sclerae are anicteric. EOMI.  NECK:  Supple, JVD is hard to assess due to her body habitus but does not  appear to be elevated. Carotids are 2+ bilaterally without any bruits.  CARDIAC:  She is bradycardic and regular, there is a 2/6 systolic ejection  murmur at the left sternal border. There is no rub or gallop.  LUNGS:  Are clear to auscultation.  ABDOMEN:  Is obese but benign, soft, nontender, there is no palpable  hepatosplenomegaly. There are no bruits.  EXTREMITIES:  Are warm with no clubbing, cyanosis or edema.  NEURO:  She is alert and oriented x3 and otherwise  nonfocal.   LABORATORY DATA:  Are pending.   EKG shows normal sinus rhythm with a third degree AV block with a junctional  escape, about 39 beats per minute.   ASSESSMENT/PLAN:  Complete heart block with symptomatic bradycardia in the  setting of hypotensive crisis. I suspect part of her heart block is  iatrogenic due to the high dose of AV nodal blockades she is getting.  Despite this, given her need for numerous antihypertensive agents, I suspect  she will likely need a permanent pacemaker.  For now the plan will be:  1.  Admit to ICU for monitoring.  2.  Hold all AV nodal blockers.  3.  Treat blood pressure with Nipride and hydralazine to a goal systolic      blood pressure between 130 and 160.  4.  Check echocardiogram, electrolytes and TSH.  5.  Will have our electrophysiology team evaluate her in the morning for a      possible pacemaker.  6.  Will go ahead and place a right IJ Cortis tonight in case she needs      transvenous pacing overnight as I suspect      she would be unlikely to capture transcutaneously.  7.  Question whether she needs an MRI, MRA of her abdomen to evaluate for      renal artery stenosis in her remaining kidney with other work up for      secondary causes of hypertension.      DB/MEDQ  D:  11/11/2004  T:  11/11/2004  Job:  [id number]   cc:   Annia Friendly. Loleta Chance, MD  [address]  [place]  [place] [address]  Fax: [phone]   Vida Roller, M.D.  Fax: [phone]

## 2011-02-06 NOTE — H&P (Signed)
NAMEADITRI, LOUISCHARLES NO.:  [id number]   MEDICAL RECORD NO.:  [id number]          PATIENT TYPE:  INP   LOCATION:  IC07                          FACILITY:  [facility]   PHYSICIAN:  Annia Friendly. Loleta Chance, MD     DATE OF BIRTH:  11-Apr-1946   DATE OF ADMISSION:  11/10/2004  DATE OF DISCHARGE:  LH                                HISTORY & PHYSICAL   IDENTIFYING DATA:  The patient is a 65 year old, widowed, unemployed, black  female from [place], [place].   CHIEF COMPLAINT:  Shortness of breath on exertion.   HISTORY OF PRESENT ILLNESS:  The patient has been experiencing shortness of  breath for one week.  The patient is sleeping on two pillows.  She denies  chest pain, syncope, nausea, frequent cough, increased edema of legs.  No  wheezing.  The patient is not able to walk very far due to shortness of  breath.  She also denies palpitations.   Medical history is positive for hypertension, insulin-dependent diabetes  mellitus, hyperlipidemia, nonobstructive coronary disease, status post  nephrectomy, and gastroesophageal reflux disease.  Medical history is  negative for tuberculosis, cancer, sickle cell, asthma, seizure disorder.   ALLERGIES:  The patient is not allergic to any medications.   PRESCRIBED MEDICATIONS:  1.  Tarka 4/240 p.o. b.i.d.  2.  Clonidine 0.2 mg p.o. b.i.d.  3.  Micardis HCT 20/12.5 p.o. every day.  4.  Vitorin 10/80 one tablet p.o. every day.  5.  Lasix 80 mg p.o. daily.  6.  K-Dur 20 mEq p.o. daily.  7.  Toprol XL 200 mg p.o. b.i.d.  8.  Actos 30 mg p.o. every day.  9.  Humulin 70/30, 6 units subcutaneously every morning and 15 units      subcutaneously every evening at supper.  10. Hydrocodone 7.5/750 one tablet p.o. q.6-8h. p.r.n. for pain.  11. Omeprazole 20 mg p.o. b.i.d.   FAMILY HISTORY:  Mother living at age 61 with history of hypertension and  heart disease.  Father deceased in his 62s secondary to heart disease.  Two  brothers,  one age 37 with history of hypertension; the other age 59 with  history of hypertension.  A daughter is living with history of diabetes; one  son is living at age 46 in good health.   PAST MEDICAL HISTORY:  1.  Hospitalization for pregnancy.  2.  Left nephrectomy secondary to cancer in December 2001 by Dr, Judie Grieve.  3.  Hysterectomy at [facility] at age 29 secondary to fibroids.  4.  Cholecystectomy by Dr. Leona Carry at [facility] secondary to      cholecystitis.  5.  Tonsillectomy at [facility] [place], in her 68s.  6.  Left knee surgery by Dr. Hilda Lias.  7.  Chest pain in September 2004 with EKG changes including left ventricular      hypertrophy by [facility].   REVIEW OF SYSTEMS:  Positive for occasional chest pain, chronic lower back  pain, chronic mild edema of legs, constipation, and fair exercise tolerance.  Review of  Systems is negative for melena, bleeding gums, hemoptysis,  hematemesis, dysuria, gross hematuria, etc.   PHYSICAL EXAMINATION:  GENERAL:  Middle-aged, overweight, medium-height,  alert black female in no apparent respiratory distress.  VITAL SIGNS:  Blood pressure 220/92, pulse 44, respirations 20, temperature  98.6.  HEAD:  Normocephalic.  SKIN:  Warm and dry.  EARS:  Normal auricles.  EYES:  Lids negative for ptosis.  Sclerae white.  Pupils equal, round, and  reactive to light.  Extraocular movements intact.  NOSE:  Negative for discharge.  MOUTH:  Positive missing teeth.  Remaining dentition fair.  No bleeding  gums.  No oral lesions.  Posterior pharynx benign.  NECK:  Negative for jugular venous distention.  No adenopathy.  No  thyromegaly.  Supraclavicular space has no palpable nodes.  LUNGS:  Clear.  HEART:  Audible S1 and S2.  Rhythm regular irregular.  Rate in the 40s.  BREASTS:  Large, pendulous.  No skin changes, no nodule on palpation.  Nipple erect without discharge.  ABDOMEN:  Obese.  Positive  healed old mid hypogastric, healed old left upper  quadrant, and healed old horizontal hypogastric surgical scars.  Soft,  nontender in all four quadrants.  No palpable mass, no organomegaly.  PELVIC:  Deferred.  RECTAL:  Deferred.  EXTREMITIES:  Knees positive for stiffness bilaterally.  Trace tibial edema.  Palpable dorsalis pedis.  NEUROLOGIC:  Alert and oriented to person, place, and time.  Cranial nerves  II-XII appeared intact.   LABORATORY DATA AND OTHER STUDIES:  EKG:  Sinus rhythm with complete heart  block.   IMPRESSION:  Primary symptomatic bradycardia secondary to complete heart  block.   SECONDARY DIAGNOSES:  1.  Hypertension.  2.  Insulin-dependent diabetes mellitus.  3.  Hyperlipidemia.  4.  Gastroesophageal reflux disease.  5.  Nonobstructive coronary disease with 30% tandem stenosis in the proximal      left anterior descending and then in the mid point left anterior      descending after the second diagonal.  6.  Status post left nephrectomy.   PLAN:  1.  Admit to ICU.  2.  Transfer to tertiary heart center for cardiology evaluation for      pacemaker (Dr. Gala Romney of [facility]).  3.  Activity:  Bed rest.  4.  Diet:  Clear liquid.  5.  Labs:  CBC, MET-7, cardiac enzymes, thyroid labs, urinalysis, hemoglobin      A1C, magnesium level.  6.  Trial of calcium chloride IV 0.5 amp.  7.  Keep 1 mg atropine at bedside.  8.  Apply pacemaker pad.  9.  IV nitroglycerin drip to treat blood pressure.  10. IV also normal saline at [facility].  11. Accu-Chek 0700, 1600, and 2200.  12. Tylenol for headache.      GKH/MEDQ  D:  11/10/2004  T:  11/10/2004  Job:  [id number]

## 2011-02-06 NOTE — Discharge Summary (Signed)
[place]  Patient:    Alyssa [name], Alyssa [name]                        MRN: [id number] Adm. Date:  [id number] Disc. Date: [id number] Attending:  Pola Corn CC:         Osvaldo Shipper. Spruill, M.D.  Wayne A. Sheffield Slider, M.D.   Discharge Summary  ADMITTING DIAGNOSES: 1. Unstable angina, rule out myocardial infarction, rule out pulmonary emboli. 2. Uncontrolled hypertension. 2. Insulin-dependent diabetes mellitus. 4. Morbid obesity. 5. Mild renal insufficiency. 6. Status post left nephrectomy for renal cell carcinoma. 7. Positive family history of pulmonary artery disease.  DISCHARGE DIAGNOSES: 1. Stable angina with negative Persantine Cardiolite. 2. Essential hypertension. 3. Insulin-dependent diabetes mellitus. 4. Morbid obesity. 5. Status post mild renal insufficiency. 6. Positive family history of coronary artery disease. 7. Status post left nephrectomy for renal cell carcinoma.  DISCHARGE MEDICATIONS: 1. Toprol XL 300 mg p.o. q.d. 2. Accupril 40 mg p.o. b.i.d. 3. Hyzaar 100/25 mg q.d. 4. Hydralazine 50 mg t.i.d. 5. Humulin N 60 units in the a.m. as before. 6. Actos 30 mg every day as before. 7. Nexium 40 mg p.o. q.d. 8. Ambien 10 mg p.o. q.h.s. p.r.n. 9. Nitrostat 0.4 mg sublingual use as directed.  ACTIVITY:  As tolerated.  DIET:  Low salt, low cholesterol, 1800 calorie ADA diet.  SPECIAL INSTRUCTIONS:  The patient has been advised to reduce weight.  The patient has been advised to monitor blood pressure regularly.  FOLLOWUP:  Follow up with Dr. Sheffield Slider in one week.  Follow up with Dr. Shana Chute in two weeks.  CONDITION ON DISCHARGE:  Stable.  HISTORY OF PRESENT ILLNESS:  Alyssa [name] is a 65 year old, African-American female with past medical history significant for hypertension, insulin-dependent diabetes mellitus, morbid obesity, positive family history of coronary artery disease who was transferred from [facility] for further  evaluation and management.  The patient complained of retrosternal chest pressure and fullness radiating to the jaw off and on for the last two weeks associated with palpitation and mild shortness of breath.  She states last night and this a.m. as her chest pressure got worse, she called EMS and was advised to go to ER.  She called her primary M.D. and was advised to go to the ER.  The patient received three baby aspirin, two sublingual nitroglycerin with partial relief and was started on IV heparin with relief of chest pain. The patient gives history of exertional dyspnea, but denies anginal pain with exertion, although her activity is limited.  She denies any PND, orthopnea or leg swelling.  The patient had left catheterization in May 1999, which showed mild coronary artery disease with LV systolic function.  PAST MEDICAL HISTORY:  As above.  PAST SURGICAL HISTORY: 1. Nephrectomy in December 2001, for renal cell carcinoma. 2. Status post cholecystectomy x 12 years ago. 3. Partial hysterectomy many years ago. 4. Right carpal tunnel surgery many years ago. 5. Tonsillectomy many years ago.  ALLERGIES:  No known drug allergies.  MEDICATIONS:  As noted.  SOCIAL HISTORY:  She is widowed and has two children.  No history of smoking or alcohol abuse presently.  Born in [place] and lives in [place].  FAMILY HISTORY:  Father died of MI at the age of 78.  Mother is alive at age 67.  She has high blood pressure and MI.  Two brothers are hypertensive.  One brother died of  hemorrhage.  PHYSICAL EXAMINATION:  GENERAL:  Alert, awake and oriented x 3 in no acute distress.  HEENT:  Conjunctivae pink.  NECK:  Supple, no JVD and no bruit.  LUNGS:  Clear to auscultation without rhonchi or rales.  CARDIAC:  Normal.  There is soft S4 gallop at the apex.  There was no S3 gallop.  ABDOMEN:  Soft, obese, bowel sounds are present and nontender.  EXTREMITIES:  No clubbing,  cyanosis, or edema, bilateral femoral and pedal pulses were equal bilaterally.  LABORATORY DATA AND X-RAY FINDINGS:  EKG showed normal sinus rhythm with nonspecific T wave changes.  Repeat EKG showed rate related bundle branch block.  H&H was 12.4 and 36.3, white count 6.3, BUN 138, potassium 3.5, glucose 308, BUN 35, creatinine 1.9.  First set of CKs 274, MB 2.8, relative index 1.0; second set CK 136, MB 4.0 with relative index of 2.3; third set of CK 182, MB 3, relative index 1.6.  CK-MB was slightly elevated at 0.39 and 0.34.  HOSPITAL COURSE:  The patient was admitted to telemetry unit.  The patient ruled in for probable small non-Q wave MI due to elevated troponin.  The patient was continued on IV nitrates and heparin.  The patient underwent Persantine Cardiolite on July 17, which showed no evidence of reversible ischemia with EF of 47%.  MRA of chest and abdomen were done due to large dissection which was negative and was negative for pulmonary embolism.  Her blood pressures were fairly well-controlled.  The patient will be discharged home on above medications and will be followed up by Dr. Shana Chute in two weeks and her primary doctor in one week. DD:  04/08/01 TD:  04/11/01 Job: [id number]

## 2011-02-06 NOTE — Op Note (Signed)
Alyssa [name], Alyssa [name] NO.:  [id number]   MEDICAL RECORD NO.:  [id number]          PATIENT TYPE:  INP   LOCATION:  2116                         FACILITY:  [facility]   PHYSICIAN:  Doylene Canning. Ladona Ridgel, M.D.  DATE OF BIRTH:  1945-10-02   DATE OF PROCEDURE:  11/11/2004  DATE OF DISCHARGE:                                 OPERATIVE REPORT   PROCEDURE PERFORMED:  Implantation of dual-chamber pacemaker.   SURGEON:  Doylene Canning. Ladona Ridgel, M.D.   INDICATIONS:  Symptomatic complete heart block.   I. INTRODUCTION:  The patient is a 65 year old woman with hypertension and a  history of renal cell carcinoma, status post nephrectomy.  She notes severe  fatigue over the last 2 weeks and felt her pulse had decreased.  She  presented to hospital where she was quite hypertensive with a heart rate in  the 30s.  Her A- nodal blocking drugs were discontinued and her blood  pressure became quite elevated in the over 200 systolic range.  Despite the  cessation of her A-V nodal blocking drugs for over 36 hours, her heart block  persisted and she is now referred for permanent pacemaker insertion.   II. PROCEDURE:  After informed consent was obtained, the patient was taken  to the diagnostic EP lab in a fasting state.  After the usual preparation  and draping, intravenous fentanyl and midazolam were given for sedation.  Thirty milliliters of lidocaine were infiltrated in the left infraclavicular  region and electrocautery utilized to dissect down to the fascial plane.  The left subclavian vein was then punctured x2 and the Medtronic model 5076  52-cm active-fixation pacing lead, serial number [id number] was advanced to  the right ventricle.  The Medtronic model 5076 45-cm active-fixation pacing  lead, serial number [id number] was advanced to the right atrium.  Mapping  was carried out in the right ventricle and in the final site, the R-waves  measured 16 mV.  The pacing threshold 0.5 volts at 0.5  milliseconds and the  pacing impedance was 729 ohms. With the lead actively fixed, 10-volt pacing  did not stimulate the diaphragm.  With the ventricular lead in satisfactory  position, attention was then turned placement the atrial lead.  Mapping was  carried out in the right atrial appendage, where P-waves measured 3 mV and  the atrial threshold at 1.3 volts at 0.5 milliseconds.  The pacing impedance  was 542 ohms. Again, 10-volt pacing did not stimulate the diaphragm with a  lead actively fixed.  With both atrial and ventricular leads in satisfactory  position, they were secured to the subpectoralis fascia with a figure-of-  eight silk suture.  In addition, the sew-in sleeve was secured with a silk  suture.  Electrocautery was utilized to make a subcutaneous pocket.  Kanamycin irrigation was utilized to irrigate the pocket and electrocautery  utilized to assure hemostasis. The Medtronic Sigma dual-chamber pacemaker,  serial number [id number] was then connected to the atrial and ventricular  pacing leads, and placed in the subcutaneous pocket.  The generator was  secured with silk suture.  Additional kanamycin  irrigation was utilized to  irrigate the pocket and the incision closed with layer of 2-0 Vicryl,  followed by 3-0 Vicryl, followed by a layer of 4-0 Vicryl. Benzoin was  painted on the skin, Steri-Strips were applied and a pressure dressing  placed, and the patient was returned to her room in satisfactory condition.   III. COMPLICATIONS:  There were no immediate procedural complications.   IV. RESULTS:  This demonstrated successful implantation of a Medtronic dual-  chamber pacemaker in a patient with complete heart block.      GWT/MEDQ  D:  11/11/2004  T:  11/12/2004  Job:  [id number]   cc:   Annia Friendly. Loleta Chance, MD  [address]  [place]  [place] [address]  Fax: [phone]   Vida Roller, M.D.  Fax: [phone]

## 2011-02-06 NOTE — Cardiovascular Report (Signed)
   NAME:  Alyssa [name], Alyssa [name]                          ACCOUNT NO.:  [id number]   MEDICAL RECORD NO.:  [id number]                   PATIENT TYPE:  INP   LOCATION:  3714                                 FACILITY:  [facility]   PHYSICIAN:  Salvadore Farber, M.D.             DATE OF BIRTH:  1946/09/09   DATE OF PROCEDURE:  05/30/2003  DATE OF DISCHARGE:                              CARDIAC CATHETERIZATION   PROCEDURES PERFORMED:  1. Left heart catheterization.  2. Left ventriculography.  3. Coronary angiography.   CARDIOLOGIST:  Salvadore Farber, M.D.   INDICATIONS:  Ms. Hitch is a 65 year old lady with diabetes mellitus and  difficult to control hypertension who was admitted with chronic exertional  dyspnea and more recent chest discomfort.  She was referred for diagnostic  angiography.   PROCEDURAL TECHNIQUE:  Informed consent was obtained.  Under 1% lidocaine  local anesthesia a 6 French sheath was placed in the right femoral artery  using the modified Seldinger technique.  Diagnostic angiography was  performed using JL-4 and JR-4 catheters.  The JR-4 catheter was advanced  into the ventricle to measure pressures.  Ventriculography was deferred to  minimize contrast load.   The patient tolerated the procedure well and was transferred to the holding  room in stable condition.  Sheaths are to be removed there.   COMPLICATIONS:  None.   FINDINGS:  1. LV 186/18/23.   1. Left ventriculogram was deferred.   1. No aortic stenosis.   1. Left Main:  Angiographically normal.   1. LAD: Moderate-sized vessel giving rise to two moderate-sized diagonal     branches.  There is a 30% stenosis of the proximal LAD and a 30% stenosis     of the mid LAD.   1. Ramus Intermedius:  Large vessel, which is angiographically normal.   1. Circumflex:  Moderate-sized vessel giving rise to two obtuse marginals.     It is angiographically normal.   1. RCA:  Moderate-sized dominant vessel, which is  angiographically normal.   IMPRESSION AND RECOMMENDATIONS:  1. Mild nonobstructive coronary disease.  2. Elevated left heart filling pressures.   Chest pain is likely due to her severe hypertension.  We will add clonidine  to her therapy.                                                 Salvadore Farber, M.D.    WED/MEDQ  D:  05/30/2003  T:  05/31/2003  Job:  [id number]   cc:   Dr. Loleta Chance  Telephone [phone]   Vida Roller, M.D.  Fax: [phone]   Jorja Loa, M.D.  [address]  [place]  [place] [address]  Fax: [phone]

## 2011-02-06 NOTE — Discharge Summary (Signed)
NAME:  Alyssa [name], Alyssa [name]                          ACCOUNT NO.:  [id number]   MEDICAL RECORD NO.:  [id number]                   PATIENT TYPE:  INP   LOCATION:  3714                                 FACILITY:  [facility]   PHYSICIAN:  Vida Roller, M.D.                DATE OF BIRTH:  1946-06-24   DATE OF ADMISSION:  05/29/2003  DATE OF DISCHARGE:  06/01/2003                           DISCHARGE SUMMARY - REFERRING   DISCHARGE DIAGNOSES:  1. Chest discomfort with EKG changes, including left ventricular     hypertrophy, inverted T-waves in inferior leads as well as biphasic T-     waves in leads V1 through V3, which are new from a previous     electrocardiogram.  Left heart catheterization on May 30, 2003,     shows nonobstructive coronary artery disease with a 30% tandem stenoses     in the proximal left anterior descending and then in the mid point left     anterior descending after the second diagonal.  2. Fatigue, nausea, vertigo, bradycardia while at this hospital.   SECONDARY DIAGNOSES:  1. Hypertension, difficult to control, on six separate hypertensive agents.  2. Dyslipidemia.  3. Status post left nephrectomy for renal cell cancer.  4. History of cardiac dysrhythmias in the past which the patient describes     as rapid rate dysrhythmias.  5. Type 2 diabetes.  6. Obesity.   The patient was discharged on June 01, 2003.  She had an episode of  nausea and vertigo on the day before, May 31, 2003.  She has done well  after that episode, but feels rather lightheaded and queasy in the stomach  today.  Her heart rate is in the high 40s with a heart rate of 48.  Telemetry shows sinus bradycardia.  Her blood pressure is 116/70, and at  this time she is due to get the following medications which she had not  gotten yet.  Calan 240 mg, hydrochlorothiazide 12.5 mg, Toprol 200 mg, Mavik  4 mg, Telmisartan 80 mg.  Also currently has a Catapres TTS 0.2 mg per 24  hour patch.  The  patient is not having any chest discomfort and she is not  particularly dyspneic.  TSH was obtained and is 2.8.  T3 2.6, T4 1.13.  She  goes home with the following medications.   DISCHARGE MEDICATIONS:  1. Catapres TTS 2 transdermal patch change weekly every Wednesday.  2. Micardis/HCT 80/12.5 b.i.d.  3. Toprol XL 200 mg b.i.d.  4. Tarka 4/240 b.i.d.  5. Lasix 80 mg daily.  6. Potassium chloride 10 mEq daily.  7. Nexium 40 mg daily.  8. Actos 30 mg at nighttime.  9. Lipitor 80 mg daily.  10.      Enteric coated aspirin 81 mg daily.  11.      Cyclobenzaprine 10 mg q.8h. as needed.  12.      Humulin  70/30 ____ units in the morning 15 units in the evening.   She was asked to hold on taking her hydralazine which was her other  antihypertensive medication, and Catapres has been substituted for this.   PAIN MANAGEMENT:  Hydrocodone as needed.   DISCHARGE ACTIVITY:  Walk daily.  Try to increase her exercise level.   DISCHARGE DIET:  Low sodium, low cholesterol, ADA diet.   FOLLOWUP:  She will have an office visit followup at [facility]  [place] on Friday, June 15, 2003, at 2 o'clock in the afternoon.   BRIEF HISTORY:  Alyssa [name] is a 65 year old female with severe hypertension.  She has had an evaluation in the past.  In 1995, she had a left heart  catheterization.  She is also status post left nephrectomy secondary to  renal cell carcinoma.  She also has diabetes and hypercholesterolemia.  She  has visited the office of [facility] on May 25, 2003, complaining of chest  discomfort with irregular heart beat and dyspnea on exertion.  The dyspnea  is relatively severe.  It is most significant when she exercises, and her  pain is located in the center of her chest and associated with a chest  fullness.  She previously had a stress test at [facility] which  showed anterior septal scar with ejection fraction 47%, but no ischemia.  No  further evaluation was done  at that time.  Over the last few months, she has  had progressively worse intermittent chest pain.  She does not smoke, does  not drink alcohol.  She has no significant family history.  She has no  jugular venous distention.  The lower extremities are without significant  edema.  Electrocardiogram shows sinus rhythm with left ventricular  hypertrophy.  She has inverted T-waves in the inferior leads as well as  biphasic T-waves in leads V1 through V3 which are new from a previous  electrocardiogram.  Because of her progressive chest discomfort and dyspnea  on exertion which is concerning for coronary artery disease, she will be  admitted to [facility] for a left heart catheterization.  She will  need in hospital hydration, but not steroid prophylaxis.  She will be seen  back at the [place] office after her heart catheterization.   HOSPITAL COURSE:  Alyssa [name] was admitted to [facility] on May 29, 2003.  She is not having any chest pain or dyspnea at the time, but she  does have episodes which are brief and with sharp chest pain.  She was  scheduled for left heart catheterization on May 30, 2003.  This was  done successfully showing mild nonobstructive coronary artery disease.  Chest pain likely due to severe hypertension.  Added clonidine and  hydralazine was held.  Her blood pressure has stabilized in the two days  following catheterization, including a blood pressure on the morning of  June 01, 2003, which was 116/70, heart rate, however is very slow at  48.  Because of this, her first dose of Jolene Provost 4/240 will be held, however,  she will take her other antihypertensives and her other  morning medications.  She goes home with the medications and followup.  It  is believed that perhaps her bradycardia is persistent may be the cause of her feeling a little bit fatigued, lightheaded, vertiginous.  Continued  careful evaluation of her antihypertensive  medications and their effect on  her blood pressure is warranted.  Maple Mirza, P.A.                    Vida Roller, M.D.    GM/MEDQ  D:  06/01/2003  T:  06/02/2003  Job:  [id number]   cc:   [facility]   Dr. Loleta Chance  Phone # [phone]   Jorja Loa, M.D.  [address]  [place]  [place] [address]  Fax: [phone]

## 2011-02-06 NOTE — Assessment & Plan Note (Signed)
[facility]                         [place] [facility] OFFICE NOTE   NAME:Alyssa [name], Alyssa [name]                       MRN:          [id number]  DATE:05/21/2006                            DOB:          [birth year]    PRIMARY CARE PHYSICIAN:  Dr. Erle Crocker.   Alyssa [name] returns for routine followup.  This is a lady we follow who has  nonobstructive coronary disease and a heart catheterization in 2004, a dual-  chamber pacemaker, Medtronic type, for tachy-brady syndrome, hypertension,  hyperlipidemia, and morbid obesity.  She presents today for routine  followup.  No complaints.   Her current medications include Toprol XL 100 twice daily, Vytorin 10 and 80  once daily, aspirin 81 mg once daily, Humalog 75/25 60 in the morning and 50  in the evening, Metformin 500 twice daily, lisinopril and  hydrochlorothiazide 20/12.5 once daily, clonidine 0.3 b.i.d., potassium 20  mEq a day.  She is on Lasix 100 mg once daily.   PHYSICAL EXAMINATION:  VITAL SIGNS:  She is 230 pounds, so she is down 8  pounds from the last time we saw her.  Blood pressure 112/80, pulse 74 and  regular.  CHEST:  Clear.  NECK:  She has no jugular venous distention or carotid bruits.  CARDIOVASCULAR:  Somewhat distant but regular with no obvious murmur.  EXTREMITIES:  Her lower extremities are without significant edema.   We have a recent set of laboratories on her.  A fasting cholesterol 139,  triglycerides 167, HDL 34, LDL 73.  Normal LFTs.   Spoke with Alyssa [name] a little bit about exercise and weight loss, which are  her two big issues.  She is pending some lower back surgery, probably going  to happen in October.  I encouraged her to get a preoperative assessment at  that time.  The pacemaker has recently been interrogate, and that is fine.  Overall, I think she is doing reasonably well.  Her secondary to prophylaxis  for coronary artery disease is really quite up to date.  As  such, I think  what we will do is see her back in 12 months for routine followup.                                   Farris Has. Dorethea Clan, MD   JMH/MedQ  DD:  05/21/2006  DT:  05/21/2006  Job #:  [id number]   cc:   Dr. Erle Crocker

## 2011-02-10 ENCOUNTER — Ambulatory Visit (INDEPENDENT_AMBULATORY_CARE_PROVIDER_SITE_OTHER): Payer: Medicare HMO | Admitting: Internal Medicine

## 2011-02-10 ENCOUNTER — Encounter: Payer: Self-pay | Admitting: Internal Medicine

## 2011-02-10 DIAGNOSIS — I509 Heart failure, unspecified: Secondary | ICD-10-CM

## 2011-02-10 DIAGNOSIS — I10 Essential (primary) hypertension: Secondary | ICD-10-CM

## 2011-02-10 DIAGNOSIS — Z950 Presence of cardiac pacemaker: Secondary | ICD-10-CM

## 2011-02-10 DIAGNOSIS — I442 Atrioventricular block, complete: Secondary | ICD-10-CM

## 2011-02-10 NOTE — Assessment & Plan Note (Signed)
Her symptoms are class II. I've asked the patient to lose weight, reduce her sodium intake, and exercise on a regular basis.

## 2011-02-10 NOTE — Assessment & Plan Note (Signed)
Her blood pressure is still not well-controlled. This is despite multiple medications. At this point I think we have little else to offer the patient other than continued trial of weight loss. I wonder if consideration for bariatric surgery would be an option. I would defer this to Dr. Loleta Chance who is her primary physician

## 2011-02-10 NOTE — Progress Notes (Signed)
HPI Alyssa [name] returns today for followup. She is a pleasant morbidly obese 65 year old woman with hypertension, diabetes, chronic diastolic heart failure, and symptomatic bradycardia. She is status post permanent pacemaker insertion. She denies syncope, chest pain, or abdominal discomfort. The patient admits that her blood pressure and her diabetes has not been well controlled. Her heart failure symptoms are class II. She admits to dietary noncompliance. Allergies  Allergen Reactions  . Simvastatin   . Verapamil      Current Outpatient Prescriptions  Medication Sig Dispense Refill  . allopurinol (ZYLOPRIM) 100 MG tablet Take 100 mg by mouth daily.        . cloNIDine (CATAPRES) 0.3 MG tablet Take 0.3 mg by mouth 3 (three) times daily.        . CRESTOR 10 MG tablet Take 10 mg by mouth daily.       [name] diltiazem (CARDIZEM) 120 MG tablet 120 mg daily.       . fish oil-omega-3 fatty acids 1000 MG capsule Take 1,000 mg by mouth 2 (two) times daily.        . furosemide (LASIX) 20 MG tablet Take 20 mg by mouth 2 (two) times daily.       . insulin NPH-insulin regular (NOVOLIN 70/30) (70-30) 100 UNIT/ML injection Inject into the skin. 60 units in am and 15 units in pm       . metoprolol (LOPRESSOR) 100 MG tablet Take 1 tablet (100 mg total) by mouth 2 (two) times daily.  60 tablet  6  . Olmesartan-Amlodipine-HCTZ (TRIBENZOR) 40-5-25 MG TABS Take 1 tablet by mouth daily.        [name] DISCONTD: diltiazem (CARDIZEM LA) 240 MG 24 hr tablet Take 1 tablet (240 mg total) by mouth daily.  30 tablet  6  . DISCONTD: Multiple Vitamin (MULTIVITAMIN) capsule Take 1 capsule by mouth daily.        [name] DISCONTD: omeprazole (PRILOSEC) 20 MG capsule Take 20 mg by mouth 2 (two) times daily.           Past Medical History  Diagnosis Date  . Sick sinus syndrome 2004    permanent  transvenous dual-chamber pacemaker (Medtronic)- 10/2004  . ASCVD (arteriosclerotic cardiovascular disease)     Nonobstructive; 30-40% LAD, Cx, and  RCA in 2007; normal EF by echo in 2006; nl stress nuclear in 2008  . CHF (congestive heart failure)     with normal LV systolic function  . Diabetes mellitus     Type II  . Hypertension   . Hyperlipidemia   . Renal cell carcinoma   . GERD (gastroesophageal reflux disease)   . Allergic rhinitis   . Low back pain     scoliosis and degenerative disc disease with  . Osteoarthritis   . Gout   . Hx of colonic polyps   . Chronic kidney disease (CKD), stage II (mild)     Creatinine-1.4 in 12/2010    ROS:   All systems reviewed and negative except as noted in the HPI.   Past Surgical History  Procedure Date  . Cataract extraction   . Tonsillectomy   . Cholecystectomy     approx. 1995  . Abdominal hysterectomy     1974  . Nephrectomy     Left nephrectomy- renal cell carcinoma  . Knee surgery   . Insert / replace / remove pacemaker     Medtronic Dual chamber pacemaker 11/11/2004     Family History  Problem Relation Age of Onset  .  Colon polyps Mother 38  . Coronary artery disease Mother   . Breast cancer Other   . Ovarian cancer Other   . Uterine cancer Other   . Heart attack Father      History   Social History  . Marital Status: Widowed    Spouse Name: N/A    Number of Children: 2  . Years of Education: N/A   Occupational History  . diabled    Social History Main Topics  . Smoking status: Never Smoker   . Smokeless tobacco: Never Used  . Alcohol Use: No  . Drug Use: No  . Sexually Active: Not on file   Other Topics Concern  . Not on file   Social History Narrative  . No narrative on file     BP 174/72  Pulse 60  Wt 230 lb (104.327 kg)  Physical Exam:  Morbidly obese appearing NAD HEENT: Unremarkable Neck:  No JVD, no thyromegally Lymphatics:  No adenopathy Back:  No CVA tenderness Lungs:  Clear HEART:  Regular rate rhythm, no murmurs, no rubs, no clicks Abd:  Flat, positive bowel sounds, no organomegally, no rebound, no guarding Ext:  2  plus pulses, no edema, no cyanosis, no clubbing Skin:  No rashes no nodules Neuro:  CN II through XII intact, motor grossly intact  DEVICE  Normal device function.  See PaceArt for details.   Assess/Plan:

## 2011-02-10 NOTE — Assessment & Plan Note (Signed)
Her device is working normally. Will recheck in several months. 

## 2011-02-10 NOTE — Patient Instructions (Signed)
Your physician recommends that you schedule a follow-up appointment in: 1 year  

## 2011-06-09 ENCOUNTER — Encounter: Payer: Self-pay | Admitting: Internal Medicine

## 2011-06-09 DIAGNOSIS — I442 Atrioventricular block, complete: Secondary | ICD-10-CM

## 2011-07-27 ENCOUNTER — Other Ambulatory Visit: Payer: Self-pay | Admitting: Cardiology

## 2011-08-27 ENCOUNTER — Other Ambulatory Visit (HOSPITAL_COMMUNITY): Payer: Self-pay | Admitting: Family Medicine

## 2011-08-27 DIAGNOSIS — Z139 Encounter for screening, unspecified: Secondary | ICD-10-CM

## 2011-09-08 ENCOUNTER — Ambulatory Visit (HOSPITAL_COMMUNITY)
Admission: RE | Admit: 2011-09-08 | Discharge: 2011-09-08 | Disposition: A | Discharge status: Home / Self Care | Payer: Medicare PPO | Source: Ambulatory Visit | Attending: Family Medicine | Admitting: Family Medicine

## 2011-09-08 ENCOUNTER — Encounter: Payer: Self-pay | Admitting: Internal Medicine

## 2011-09-08 DIAGNOSIS — I442 Atrioventricular block, complete: Secondary | ICD-10-CM

## 2011-09-08 DIAGNOSIS — Z139 Encounter for screening, unspecified: Secondary | ICD-10-CM

## 2011-09-08 DIAGNOSIS — Z1231 Encounter for screening mammogram for malignant neoplasm of breast: Secondary | ICD-10-CM | POA: Insufficient documentation

## 2011-12-09 ENCOUNTER — Encounter: Payer: Self-pay | Admitting: Internal Medicine

## 2011-12-09 DIAGNOSIS — I442 Atrioventricular block, complete: Secondary | ICD-10-CM

## 2012-02-03 ENCOUNTER — Encounter: Payer: Self-pay | Admitting: Internal Medicine

## 2012-02-03 ENCOUNTER — Ambulatory Visit (INDEPENDENT_AMBULATORY_CARE_PROVIDER_SITE_OTHER): Payer: Medicare PPO | Admitting: Internal Medicine

## 2012-02-03 VITALS — BP 164/80 | HR 75 | Ht 64.0 in | Wt 231.0 lb

## 2012-02-03 DIAGNOSIS — I2510 Atherosclerotic heart disease of native coronary artery without angina pectoris: Secondary | ICD-10-CM

## 2012-02-03 DIAGNOSIS — I10 Essential (primary) hypertension: Secondary | ICD-10-CM

## 2012-02-03 DIAGNOSIS — Z950 Presence of cardiac pacemaker: Secondary | ICD-10-CM

## 2012-02-03 DIAGNOSIS — I495 Sick sinus syndrome: Secondary | ICD-10-CM

## 2012-02-03 LAB — PACEMAKER DEVICE OBSERVATION
AL AMPLITUDE: 2.8 mv
AL IMPEDENCE PM: 450 Ohm
AL THRESHOLD: 0.75 V
BAMS-0001: 150 {beats}/min
RV LEAD IMPEDENCE PM: 467 Ohm
RV LEAD THRESHOLD: 1 V

## 2012-02-03 NOTE — Patient Instructions (Addendum)
Your physician wants you to follow-up in: 1 year with Dr Court Joy will receive a reminder letter in the mail two months in advance. If you don't receive a letter, please call our office to schedule the follow-up appointment.  Your physician has recommended you make the following change in your medication:  1) Increase lasix (furosemide) to 20 mg three tablets by mouth twice daily for 3 days, then resume your current dose.  Decrease the sodium in your diet. Sodium-Controlled Diet Sodium is a mineral. It is found in many foods. Sodium may be found naturally or added during the making of a food. The most common form of sodium is salt, which is made up of sodium and chloride. Reducing your sodium intake involves changing your eating habits. The following guidelines will help you reduce the sodium in your diet:  Stop using the salt shaker.   Use salt sparingly in cooking and baking.   Substitute with sodium-free seasonings and spices.   Do not use a salt substitute (potassium chloride) without your caregiver's permission.   Include a variety of fresh, unprocessed foods in your diet.   Limit the use of processed and convenience foods that are high in sodium.  USE THE FOLLOWING FOODS SPARINGLY: Breads/Starches  Commercial bread stuffing, commercial pancake or waffle mixes, coating mixes. Waffles. Croutons. Prepared (boxed or frozen) potato, rice, or noodle mixes that contain salt or sodium. Salted [place] fries or hash browns. Salted popcorn, breads, crackers, chips, or snack foods.  Vegetables  Vegetables canned with salt or prepared in cream, butter, or cheese sauces. Sauerkraut. Tomato or vegetable juices canned with salt.   Fresh vegetables are allowed if rinsed thoroughly.  Fruit  Fruit is okay to eat.  Meat and Meat Substitutes  Salted or smoked meats, such as bacon or Canadian bacon, chipped or corned beef, hot dogs, salt pork, luncheon meats, pastrami, ham, or sausage. Canned or  smoked fish, poultry, or meat. Processed cheese or cheese spreads, blue or Roquefort cheese. Battered or frozen fish products. Prepared spaghetti sauce. Baked beans. Reuben sandwiches. Salted nuts. Caviar.  Milk  Limit buttermilk to 1 cup per week.  Soups and Combination Foods  Bouillon cubes, canned or dried soups, broth, consomm. Convenience (frozen or packaged) dinners with more than 600 mg sodium. Pot pies, pizza, Asian food, fast food cheeseburgers, and specialty sandwiches.  Desserts and Sweets  Regular (salted) desserts, pie, commercial fruit snack pies, commercial snack cakes, canned puddings.   Eat desserts and sweets in moderation.  Fats and Oils  Gravy mixes or canned gravy. No more than 1 to 2 tbs of salad dressing. Chip dips.   Eat fats and oils in moderation.  Beverages  See those listed under the vegetables and milk groups.  Condiments  Ketchup, mustard, meat sauces, salsa, regular (salted) and lite soy sauce or mustard. Dill pickles, olives, meat tenderizer. Prepared horseradish or pickle relish. Dutch-processed cocoa. Baking powder or baking soda used medicinally. Worcestershire sauce. "Light" salt. Salt substitute, unless approved by your caregiver.  Document Released: 02/27/2002 Document Revised: 08/27/2011 Document Reviewed: 09/30/2009 [facility] Patient Information 2012 [place], [place].

## 2012-02-10 NOTE — Assessment & Plan Note (Signed)
She denies anginal symptoms. Continue current meds. 

## 2012-02-10 NOTE — Assessment & Plan Note (Signed)
Her device is working normally. Will recheck in several months. 

## 2012-02-10 NOTE — Progress Notes (Signed)
HPI Alyssa [name] returns today for followup. She is a pleasant 66 yo woman with symptomatic bradycardia s/p PPM, CAD, and HTN. She denies chest pain, sob, or syncope. Allergies  Allergen Reactions  . Simvastatin   . Verapamil      Current Outpatient Prescriptions  Medication Sig Dispense Refill  . allopurinol (ZYLOPRIM) 100 MG tablet Take 100 mg by mouth daily.        . cloNIDine (CATAPRES) 0.3 MG tablet Take 0.3 mg by mouth 3 (three) times daily.        [name] diltiazem (CARDIZEM) 120 MG tablet 120 mg daily.       . furosemide (LASIX) 20 MG tablet Take 20 mg by mouth 2 (two) times daily.       [name] HUMALOG MIX 75/25 (75-25) 100 UNIT/ML SUSP       . loratadine (CLARITIN) 10 MG tablet Take 10 mg by mouth daily.      . metoprolol (LOPRESSOR) 100 MG tablet       . Olmesartan-Amlodipine-HCTZ (TRIBENZOR) 40-5-25 MG TABS Take 1 tablet by mouth daily.        [name] omeprazole (PRILOSEC) 20 MG capsule       . fish oil-omega-3 fatty acids 1000 MG capsule Take 1,000 mg by mouth 2 (two) times daily.        . insulin NPH-insulin regular (NOVOLIN 70/30) (70-30) 100 UNIT/ML injection Inject into the skin. 60 units in am and 15 units in pm          Past Medical History  Diagnosis Date  . Sick sinus syndrome 2004    permanent  transvenous dual-chamber pacemaker (Medtronic)- 10/2004  . ASCVD (arteriosclerotic cardiovascular disease)     Nonobstructive; 30-40% LAD, Cx, and RCA in 2007; normal EF by echo in 2006; nl stress nuclear in 2008  . CHF (congestive heart failure)     with normal LV systolic function  . Diabetes mellitus     Type II  . Hypertension   . Hyperlipidemia   . Renal cell carcinoma   . GERD (gastroesophageal reflux disease)   . Allergic rhinitis   . Low back pain     scoliosis and degenerative disc disease with  . Osteoarthritis   . Gout   . Hx of colonic polyps   . Chronic kidney disease (CKD), stage II (mild)     Creatinine-1.4 in 12/2010    ROS:   All systems reviewed and negative  except as noted in the HPI.   Past Surgical History  Procedure Date  . Cataract extraction   . Tonsillectomy   . Cholecystectomy     approx. 1995  . Abdominal hysterectomy     1974  . Nephrectomy     Left nephrectomy- renal cell carcinoma  . Knee surgery   . Insert / replace / remove pacemaker     Medtronic Dual chamber pacemaker 11/11/2004     Family History  Problem Relation Age of Onset  . Colon polyps Mother 36  . Coronary artery disease Mother   . Breast cancer Other   . Ovarian cancer Other   . Uterine cancer Other   . Heart attack Father      History   Social History  . Marital Status: Widowed    Spouse Name: N/A    Number of Children: 2  . Years of Education: N/A   Occupational History  . diabled    Social History Main Topics  . Smoking status: Never Smoker   .  Smokeless tobacco: Never Used  . Alcohol Use: No  . Drug Use: No  . Sexually Active: Not on file   Other Topics Concern  . Not on file   Social History Narrative  . No narrative on file     BP 164/80  Pulse 75  Ht 5\' 4"  (1.626 m)  Wt 231 lb (104.781 kg)  BMI 39.65 kg/m2  Physical Exam:  Well appearing middle aged woman,NAD HEENT: Unremarkable Neck:  No JVD, no thyromegally Lungs:  Clear with no wheezes, rales, or rhonchi HEART:  Regular rate rhythm, no murmurs, no rubs, no clicks Abd:  soft, positive bowel sounds, no organomegally, no rebound, no guarding Ext:  2 plus pulses, no edema, no cyanosis, no clubbing Skin:  No rashes no nodules Neuro:  CN II through XII intact, motor grossly intact  DEVICE  Normal device function.  See PaceArt for details.   Assess/Plan:

## 2012-02-10 NOTE — Assessment & Plan Note (Signed)
Her blood pressure is elevated today. I have asked the patient to reduce her sodium intake. She will continue her current meds.

## 2012-03-21 DIAGNOSIS — I442 Atrioventricular block, complete: Secondary | ICD-10-CM

## 2012-04-25 ENCOUNTER — Encounter (HOSPITAL_COMMUNITY): Payer: Medicare PPO

## 2012-04-26 ENCOUNTER — Ambulatory Visit (HOSPITAL_COMMUNITY)
Admission: RE | Admit: 2012-04-26 | Discharge: 2012-04-26 | Disposition: A | Discharge status: Home / Self Care | Payer: Medicare PPO | Source: Ambulatory Visit | Attending: Family Medicine | Admitting: Family Medicine

## 2012-04-26 DIAGNOSIS — E119 Type 2 diabetes mellitus without complications: Secondary | ICD-10-CM | POA: Insufficient documentation

## 2012-04-26 DIAGNOSIS — R0602 Shortness of breath: Secondary | ICD-10-CM | POA: Insufficient documentation

## 2012-04-26 DIAGNOSIS — I10 Essential (primary) hypertension: Secondary | ICD-10-CM | POA: Insufficient documentation

## 2012-04-26 NOTE — Progress Notes (Signed)
48hr Holter Monitor has been performed.

## 2012-04-29 ENCOUNTER — Ambulatory Visit (HOSPITAL_COMMUNITY)
Admission: RE | Admit: 2012-04-29 | Discharge: 2012-04-29 | Disposition: A | Discharge status: Home / Self Care | Payer: Medicare PPO | Source: Ambulatory Visit | Attending: Family Medicine | Admitting: Family Medicine

## 2012-04-29 ENCOUNTER — Other Ambulatory Visit (HOSPITAL_COMMUNITY): Payer: Self-pay | Admitting: Family Medicine

## 2012-04-29 DIAGNOSIS — R0602 Shortness of breath: Secondary | ICD-10-CM

## 2012-07-01 ENCOUNTER — Ambulatory Visit (HOSPITAL_COMMUNITY)
Admission: RE | Admit: 2012-07-01 | Discharge: 2012-07-01 | Disposition: A | Discharge status: Home / Self Care | Payer: Medicare PPO | Source: Ambulatory Visit | Attending: Family Medicine | Admitting: Family Medicine

## 2012-07-01 ENCOUNTER — Other Ambulatory Visit (HOSPITAL_COMMUNITY): Payer: Self-pay | Admitting: Family Medicine

## 2012-07-01 DIAGNOSIS — M25559 Pain in unspecified hip: Secondary | ICD-10-CM | POA: Insufficient documentation

## 2012-07-01 DIAGNOSIS — Z862 Personal history of diseases of the blood and blood-forming organs and certain disorders involving the immune mechanism: Secondary | ICD-10-CM | POA: Insufficient documentation

## 2012-07-01 DIAGNOSIS — Z8639 Personal history of other endocrine, nutritional and metabolic disease: Secondary | ICD-10-CM | POA: Insufficient documentation

## 2012-07-01 DIAGNOSIS — G8929 Other chronic pain: Secondary | ICD-10-CM

## 2012-07-01 DIAGNOSIS — M79609 Pain in unspecified limb: Secondary | ICD-10-CM | POA: Insufficient documentation

## 2012-07-14 DIAGNOSIS — I442 Atrioventricular block, complete: Secondary | ICD-10-CM

## 2012-10-13 DIAGNOSIS — I442 Atrioventricular block, complete: Secondary | ICD-10-CM

## 2013-01-12 DIAGNOSIS — I442 Atrioventricular block, complete: Secondary | ICD-10-CM

## 2013-04-13 DIAGNOSIS — I442 Atrioventricular block, complete: Secondary | ICD-10-CM

## 2013-04-18 ENCOUNTER — Encounter: Payer: Self-pay | Admitting: *Deleted

## 2013-06-20 ENCOUNTER — Ambulatory Visit (INDEPENDENT_AMBULATORY_CARE_PROVIDER_SITE_OTHER): Payer: Medicare PPO | Admitting: Internal Medicine

## 2013-06-20 ENCOUNTER — Encounter: Payer: Self-pay | Admitting: Internal Medicine

## 2013-06-20 DIAGNOSIS — I2510 Atherosclerotic heart disease of native coronary artery without angina pectoris: Secondary | ICD-10-CM

## 2013-06-20 DIAGNOSIS — Z950 Presence of cardiac pacemaker: Secondary | ICD-10-CM

## 2013-06-20 DIAGNOSIS — I7090 Unspecified atherosclerosis: Secondary | ICD-10-CM

## 2013-06-20 NOTE — Progress Notes (Signed)
HPI Alyssa [name] returns today for followup. She is a pleasant 67 yo woman with symptomatic bradycardia s/p PPM, CAD, and HTN. She denies chest pain, sob, or syncope. Allergies  Allergen Reactions  . Simvastatin   . Verapamil      Current Outpatient Prescriptions  Medication Sig Dispense Refill  . allopurinol (ZYLOPRIM) 100 MG tablet Take 100 mg by mouth daily.        . cloNIDine (CATAPRES) 0.3 MG tablet Take 0.3 mg by mouth 3 (three) times daily.        [name] diltiazem (CARDIZEM) 120 MG tablet 120 mg daily.       . fish oil-omega-3 fatty acids 1000 MG capsule Take 1,000 mg by mouth 2 (two) times daily.        . furosemide (LASIX) 20 MG tablet Take 20 mg by mouth 2 (two) times daily.       [name] HUMALOG MIX 75/25 (75-25) 100 UNIT/ML SUSP       . insulin NPH-insulin regular (NOVOLIN 70/30) (70-30) 100 UNIT/ML injection Inject into the skin. 60 units in am and 15 units in pm       . loratadine (CLARITIN) 10 MG tablet Take 10 mg by mouth daily.      . metoprolol (LOPRESSOR) 100 MG tablet       . Olmesartan-Amlodipine-HCTZ (TRIBENZOR) 40-5-25 MG TABS Take 1 tablet by mouth daily.        [name] omeprazole (PRILOSEC) 20 MG capsule        No current facility-administered medications for this visit.     Past Medical History  Diagnosis Date  . Sick sinus syndrome 2004    permanent  transvenous dual-chamber pacemaker (Medtronic)- 10/2004  . ASCVD (arteriosclerotic cardiovascular disease)     Nonobstructive; 30-40% LAD, Cx, and RCA in 2007; normal EF by echo in 2006; nl stress nuclear in 2008  . CHF (congestive heart failure)     with normal LV systolic function  . Diabetes mellitus     Type II  . Hypertension   . Hyperlipidemia   . Renal cell carcinoma   . GERD (gastroesophageal reflux disease)   . Allergic rhinitis   . Low back pain     scoliosis and degenerative disc disease with  . Osteoarthritis   . Gout   . Hx of colonic polyps   . Chronic kidney disease (CKD), stage II (mild)    Creatinine-1.4 in 12/2010    ROS:   All systems reviewed and negative except as noted in the HPI.   Past Surgical History  Procedure Laterality Date  . Cataract extraction    . Tonsillectomy    . Cholecystectomy      approx. 1995  . Abdominal hysterectomy      1974  . Nephrectomy      Left nephrectomy- renal cell carcinoma  . Knee surgery    . Insert / replace / remove pacemaker      Medtronic Dual chamber pacemaker 11/11/2004     Family History  Problem Relation Age of Onset  . Colon polyps Mother 76  . Coronary artery disease Mother   . Breast cancer Other   . Ovarian cancer Other   . Uterine cancer Other   . Heart attack Father      History   Social History  . Marital Status: Widowed    Spouse Name: N/A    Number of Children: 2  . Years of Education: N/A   Occupational History  . diabled  Social History Main Topics  . Smoking status: Never Smoker   . Smokeless tobacco: Never Used  . Alcohol Use: No  . Drug Use: No  . Sexual Activity: Not on file   Other Topics Concern  . Not on file   Social History Narrative  . No narrative on file     There were no vitals taken for this visit.  Physical Exam:  Well appearing middle aged woman,NAD HEENT: Unremarkable Neck:  No JVD, no thyromegally Lungs:  Clear with no wheezes, rales, or rhonchi HEART:  Regular rate rhythm, no murmurs, no rubs, no clicks Abd:  soft, positive bowel sounds, no organomegally, no rebound, no guarding Ext:  2 plus pulses, no edema, no cyanosis, no clubbing Skin:  No rashes no nodules Neuro:  CN II through XII intact, motor grossly intact  DEVICE  Normal device function.  See PaceArt for details.   Assess/Plan:

## 2013-06-22 NOTE — Assessment & Plan Note (Signed)
She denies anginal symptoms. She will continue medical therapy

## 2013-06-22 NOTE — Assessment & Plan Note (Signed)
She has normal device function. We'll recheck in several months.

## 2013-06-23 ENCOUNTER — Encounter: Payer: Self-pay | Admitting: Internal Medicine

## 2013-07-13 DIAGNOSIS — I442 Atrioventricular block, complete: Secondary | ICD-10-CM

## 2013-08-02 ENCOUNTER — Encounter: Payer: Self-pay | Admitting: Internal Medicine

## 2013-08-14 ENCOUNTER — Encounter (INDEPENDENT_AMBULATORY_CARE_PROVIDER_SITE_OTHER): Payer: Medicare PPO | Admitting: Ophthalmology

## 2013-08-14 DIAGNOSIS — E11319 Type 2 diabetes mellitus with unspecified diabetic retinopathy without macular edema: Secondary | ICD-10-CM

## 2013-08-14 DIAGNOSIS — H2510 Age-related nuclear cataract, unspecified eye: Secondary | ICD-10-CM

## 2013-08-14 DIAGNOSIS — E1139 Type 2 diabetes mellitus with other diabetic ophthalmic complication: Secondary | ICD-10-CM

## 2013-08-14 DIAGNOSIS — I10 Essential (primary) hypertension: Secondary | ICD-10-CM

## 2013-08-14 DIAGNOSIS — H43819 Vitreous degeneration, unspecified eye: Secondary | ICD-10-CM

## 2013-08-14 DIAGNOSIS — H35039 Hypertensive retinopathy, unspecified eye: Secondary | ICD-10-CM

## 2013-09-19 ENCOUNTER — Encounter: Payer: Self-pay | Admitting: Cardiology

## 2013-09-19 ENCOUNTER — Ambulatory Visit (INDEPENDENT_AMBULATORY_CARE_PROVIDER_SITE_OTHER): Payer: Medicare PPO | Admitting: Cardiology

## 2013-09-19 VITALS — BP 172/51 | HR 60 | Ht 64.0 in | Wt 218.0 lb

## 2013-09-19 DIAGNOSIS — I495 Sick sinus syndrome: Secondary | ICD-10-CM

## 2013-09-19 DIAGNOSIS — I10 Essential (primary) hypertension: Secondary | ICD-10-CM

## 2013-09-19 DIAGNOSIS — E785 Hyperlipidemia, unspecified: Secondary | ICD-10-CM

## 2013-09-19 DIAGNOSIS — I2510 Atherosclerotic heart disease of native coronary artery without angina pectoris: Secondary | ICD-10-CM

## 2013-09-19 NOTE — Assessment & Plan Note (Signed)
Patient with history of nonobstructive CAD by catheterization in 2007. She is reporting intermittent exertional chest pain symptoms over the last year that she describes as being negative. Her ECG shows a dual chamber paced rhythm. Last ischemic workup was in 2008. We will therefore proceed with followup testing including an echocardiogram and a Lexiscan Cardiolite for ischemic surveillance and structural cardiac evaluation. No change in medication for now.

## 2013-09-19 NOTE — Patient Instructions (Signed)
Your physician wants you to follow-up in: 6 months You will receive a reminder letter in the mail two months in advance. If you don't receive a letter, please call our office to schedule the follow-up appointment.    Your physician recommends that you continue on your current medications as directed. Please refer to the Current Medication list given to you today.   Your physician has requested that you have an echocardiogram. Echocardiography is a painless test that uses sound waves to create images of your heart. It provides your doctor with information about the size and shape of your heart and how well your heart's chambers and valves are working. This procedure takes approximately one hour. There are no restrictions for this procedure.    

## 2013-09-19 NOTE — Assessment & Plan Note (Signed)
Lipids have been followed by Dr. Loleta Chance. She is on omega-3 supplements at present.

## 2013-09-19 NOTE — Assessment & Plan Note (Signed)
Status post pacemaker placement, followed by Dr. Ladona Ridgel. Normal device function at last check.

## 2013-09-19 NOTE — Progress Notes (Signed)
Clinical Summary Alyssa [name] is a 67 y.o.female presenting for an office visit. She is a former patient of Dr. Dietrich Pates, last seen in 2012. She has had interval followup with Dr. Ladona Ridgel for device interrogation. History is reviewed below - this is our first meeting.  She reports intermittent episodes of exertional chest pain over the last year, typical and atypical features for angina. Her last ischemic evaluation was in 2008 with low risk Myoview in LVEF 55%.  ECG shows dual-chamber pacing.  She reports compliance with her medications and regular followup with Dr. Loleta Chance.   Allergies  Allergen Reactions  . Simvastatin   . Verapamil     Current Outpatient Prescriptions  Medication Sig Dispense Refill  . allopurinol (ZYLOPRIM) 100 MG tablet Take 100 mg by mouth daily.        . cloNIDine (CATAPRES) 0.3 MG tablet Take 0.3 mg by mouth 3 (three) times daily.        [name] diltiazem (CARDIZEM) 120 MG tablet 120 mg daily.       . fish oil-omega-3 fatty acids 1000 MG capsule Take 1,000 mg by mouth 2 (two) times daily.        [name] FLUZONE HIGH-DOSE injection       . HUMALOG MIX 75/25 (75-25) 100 UNIT/ML SUSP       . insulin NPH-insulin regular (NOVOLIN 70/30) (70-30) 100 UNIT/ML injection Inject into the skin. 60 units in am and 15 units in pm       . metoprolol (LOPRESSOR) 100 MG tablet       . Olmesartan-Amlodipine-HCTZ (TRIBENZOR) 40-5-25 MG TABS Take 1 tablet by mouth daily.        [name] omeprazole (PRILOSEC) 20 MG capsule 2 (two) times daily before a meal.       . tiZANidine (ZANAFLEX) 4 MG tablet        No current facility-administered medications for this visit.    Past Medical History  Diagnosis Date  . Sick sinus syndrome 2004    Medtronic PPM 10/2004 - Dr. Ladona Ridgel  . Coronary atherosclerosis of native coronary artery     Nonobstructive; 30-40% LAD, Cx, and RCA in 2007; normal EF by echo in 2006; nl stress nuclear in 2008  . Chronic diastolic heart failure   . Type 2 diabetes mellitus     . Essential hypertension, benign   . Hyperlipidemia   . Renal cell carcinoma   . GERD (gastroesophageal reflux disease)   . Allergic rhinitis   . Low back pain     Scoliosis and degenerative disc disease with  . Osteoarthritis   . Gout   . Hx of colonic polyps   . CKD (chronic kidney disease) stage 2, GFR 60-89 ml/min     Past Surgical History  Procedure Laterality Date  . Cataract extraction    . Tonsillectomy    . Cholecystectomy      Approx. 1995  . Abdominal hysterectomy      1974  . Nephrectomy      Left nephrectomy- renal cell carcinoma  . Knee surgery    . Insert / replace / remove pacemaker      Medtronic Dual chamber pacemaker 11/11/2004    Social History Alyssa [name] reports that she has never smoked. She has never used smokeless tobacco. Alyssa [name] reports that she does not drink alcohol.  Review of Systems No palpitations or syncope. No orthopnea or PND. No claudication.  Physical Examination Filed Vitals:   09/19/13 1416  BP:  172/51  Pulse: 60   Filed Weights   09/19/13 1416  Weight: 218 lb (98.884 kg)   Overweight woman, appears comfortable at rest. HEENT: Conjunctiva and lids normal, oropharynx clear. Neck: Supple, increased girth with no obvious elevated JVP or carotid bruits, no thyromegaly. Lungs: Clear to auscultation, nonlabored breathing at rest. Cardiac: Regular rate and rhythm, no S3, soft systolic murmur, no pericardial rub. Abdomen: Soft, nontender, bowel sounds present, no guarding or rebound. Extremities: No pitting edema, distal pulses 2+. Skin: Warm and dry. Musculoskeletal: No kyphosis. Neuropsychiatric: Alert and oriented x3, affect grossly appropriate.   Problem List and Plan   Coronary atherosclerosis of native coronary artery Patient with history of nonobstructive CAD by catheterization in 2007. She is reporting intermittent exertional chest pain symptoms over the last year that she describes as being negative. Her ECG shows a  dual chamber paced rhythm. Last ischemic workup was in 2008. We will therefore proceed with followup testing including an echocardiogram and a Lexiscan Cardiolite for ischemic surveillance and structural cardiac evaluation. No change in medication for now.  HYPERLIPIDEMIA Lipids have been followed by Dr. Loleta Chance. She is on omega-3 supplements at present.  SICK SINUS SYNDROME Status post pacemaker placement, followed by Dr. Ladona Ridgel. Normal device function at last check.    Jonelle Sidle, M.D., F.A.C.C.

## 2013-09-28 ENCOUNTER — Encounter (HOSPITAL_COMMUNITY): Payer: Medicare PPO

## 2013-09-28 ENCOUNTER — Other Ambulatory Visit (HOSPITAL_COMMUNITY): Payer: Medicare PPO

## 2013-09-28 DIAGNOSIS — I495 Sick sinus syndrome: Secondary | ICD-10-CM | POA: Insufficient documentation

## 2013-09-28 DIAGNOSIS — I5030 Unspecified diastolic (congestive) heart failure: Secondary | ICD-10-CM | POA: Insufficient documentation

## 2013-09-29 ENCOUNTER — Other Ambulatory Visit (HOSPITAL_COMMUNITY): Payer: Medicare PPO

## 2013-09-29 ENCOUNTER — Ambulatory Visit (HOSPITAL_COMMUNITY)
Admission: RE | Admit: 2013-09-29 | Discharge: 2013-09-29 | Disposition: A | Discharge status: Home / Self Care | Payer: Medicare PPO | Source: Ambulatory Visit | Attending: Cardiology | Admitting: Cardiology

## 2013-09-29 DIAGNOSIS — N189 Chronic kidney disease, unspecified: Secondary | ICD-10-CM | POA: Insufficient documentation

## 2013-09-29 DIAGNOSIS — E119 Type 2 diabetes mellitus without complications: Secondary | ICD-10-CM | POA: Insufficient documentation

## 2013-09-29 DIAGNOSIS — E785 Hyperlipidemia, unspecified: Secondary | ICD-10-CM | POA: Insufficient documentation

## 2013-09-29 DIAGNOSIS — I2510 Atherosclerotic heart disease of native coronary artery without angina pectoris: Secondary | ICD-10-CM | POA: Insufficient documentation

## 2013-09-29 DIAGNOSIS — I059 Rheumatic mitral valve disease, unspecified: Secondary | ICD-10-CM

## 2013-09-29 DIAGNOSIS — I509 Heart failure, unspecified: Secondary | ICD-10-CM | POA: Insufficient documentation

## 2013-09-29 NOTE — Progress Notes (Signed)
*  PRELIMINARY RESULTS* Echocardiogram 2D Echocardiogram has been performed.  Lorenzo, [place] 09/29/2013, 2:40 PM

## 2013-10-02 ENCOUNTER — Telehealth: Payer: Self-pay

## 2013-10-02 DIAGNOSIS — I10 Essential (primary) hypertension: Secondary | ICD-10-CM

## 2013-10-02 NOTE — Telephone Encounter (Signed)
Lab request to [facility]

## 2013-10-02 NOTE — Telephone Encounter (Signed)
Needs bmet before possible diuretic tx after seeing echo report per Dr.McDowell lm at home number as work number doesn't accept incoming calls lab request entered to [facility]

## 2013-10-04 ENCOUNTER — Encounter (HOSPITAL_COMMUNITY)
Admission: RE | Admit: 2013-10-04 | Discharge: 2013-10-04 | Disposition: A | Discharge status: Home / Self Care | Payer: Medicare PPO | Source: Ambulatory Visit | Attending: Cardiology | Admitting: Cardiology

## 2013-10-04 ENCOUNTER — Encounter: Payer: Self-pay | Admitting: *Deleted

## 2013-10-04 ENCOUNTER — Telehealth: Payer: Self-pay

## 2013-10-04 ENCOUNTER — Encounter (HOSPITAL_COMMUNITY): Payer: Self-pay

## 2013-10-04 DIAGNOSIS — I2510 Atherosclerotic heart disease of native coronary artery without angina pectoris: Secondary | ICD-10-CM

## 2013-10-04 LAB — BASIC METABOLIC PANEL
BUN: 34 mg/dL — ABNORMAL HIGH (ref 6–23)
CO2: 29 meq/L (ref 19–32)
Calcium: 9.3 mg/dL (ref 8.4–10.5)
Chloride: 93 mEq/L — ABNORMAL LOW (ref 96–112)
Creat: 2.11 mg/dL — ABNORMAL HIGH (ref 0.50–1.10)
GLUCOSE: 652 mg/dL — AB (ref 70–99)
POTASSIUM: 3.5 meq/L (ref 3.5–5.3)
SODIUM: 134 meq/L — AB (ref 135–145)

## 2013-10-04 MED ORDER — TECHNETIUM TC 99M SESTAMIBI - CARDIOLITE
10.0000 | Freq: Once | INTRAVENOUS | Status: AC | PRN
  Administered 2013-10-04: 10 via INTRAVENOUS

## 2013-10-04 MED ORDER — SODIUM CHLORIDE 0.9 % IJ SOLN
INTRAMUSCULAR | Status: AC
  Administered 2013-10-04: 10 mL via INTRAVENOUS
  Filled 2013-10-04: qty 10

## 2013-10-04 MED ORDER — REGADENOSON 0.4 MG/5ML IV SOLN
INTRAVENOUS | Status: AC
  Administered 2013-10-04: 0.4 mg via INTRAVENOUS
  Filled 2013-10-04: qty 5

## 2013-10-04 MED ORDER — TECHNETIUM TC 99M SESTAMIBI - CARDIOLITE
30.0000 | Freq: Once | INTRAVENOUS | Status: AC | PRN
  Administered 2013-10-04: 12:00:00 30 via INTRAVENOUS

## 2013-10-04 NOTE — Telephone Encounter (Signed)
Alyssa [name] at [facility] reports critical glucose 652 mg/dl ,  2012 was 340 mg/dl   Creatinine 2.11, 2012 was 1.42

## 2013-10-04 NOTE — Progress Notes (Signed)
Stress Lab Nurses Notes - [facility]  KAYLIANNA DETERT 10/04/2013 Reason for doing test: CAD and Chest Pain Type of test: Wille Glaser Nurse performing test: Gerrit Halls, RN Nuclear Medicine Tech: Dyanne Carrel Echo Tech: Not Applicable MD performing test: Dr. Clearance Coots.Bonnell Public PA Family MD: Dr. Berdine Addison Test explained and consent signed: yes IV started: 22g jelco, Saline lock flushed, No redness or edema and Saline lock started in radiology Symptoms: Stomach pressure & Cough Treatment/Intervention: None Reason test stopped: protocol completed After recovery IV was: Discontinued via X-ray tech and No redness or edema Patient to return to [facility]. Med at : 12:00 Patient discharged: Home Patient's Condition upon discharge was: stable Comments: During test BP 172/77 & HR 65. Recovery BP 155/80 & HR 60.  Symptoms resolved in recovery. Geanie Cooley T

## 2013-10-16 ENCOUNTER — Encounter: Payer: Self-pay | Admitting: Internal Medicine

## 2013-10-16 DIAGNOSIS — I442 Atrioventricular block, complete: Secondary | ICD-10-CM

## 2013-11-17 ENCOUNTER — Encounter: Payer: Self-pay | Admitting: Internal Medicine

## 2013-11-17 DIAGNOSIS — I442 Atrioventricular block, complete: Secondary | ICD-10-CM

## 2013-12-19 ENCOUNTER — Other Ambulatory Visit (HOSPITAL_COMMUNITY): Payer: Self-pay | Admitting: Family Medicine

## 2013-12-19 DIAGNOSIS — Z905 Acquired absence of kidney: Secondary | ICD-10-CM

## 2013-12-25 ENCOUNTER — Ambulatory Visit (HOSPITAL_COMMUNITY)
Admission: RE | Admit: 2013-12-25 | Discharge: 2013-12-25 | Disposition: A | Discharge status: Home / Self Care | Payer: Medicare PPO | Source: Ambulatory Visit | Attending: Family Medicine | Admitting: Family Medicine

## 2013-12-25 DIAGNOSIS — Z09 Encounter for follow-up examination after completed treatment for conditions other than malignant neoplasm: Secondary | ICD-10-CM | POA: Insufficient documentation

## 2013-12-25 DIAGNOSIS — Z905 Acquired absence of kidney: Secondary | ICD-10-CM | POA: Insufficient documentation

## 2013-12-25 DIAGNOSIS — Z85528 Personal history of other malignant neoplasm of kidney: Secondary | ICD-10-CM | POA: Insufficient documentation

## 2014-02-14 ENCOUNTER — Ambulatory Visit (INDEPENDENT_AMBULATORY_CARE_PROVIDER_SITE_OTHER): Payer: Medicare PPO | Admitting: Ophthalmology

## 2014-02-22 ENCOUNTER — Encounter: Payer: Self-pay | Admitting: Internal Medicine

## 2014-02-22 DIAGNOSIS — I442 Atrioventricular block, complete: Secondary | ICD-10-CM

## 2014-03-14 ENCOUNTER — Ambulatory Visit (INDEPENDENT_AMBULATORY_CARE_PROVIDER_SITE_OTHER): Payer: Medicare PPO | Admitting: Ophthalmology

## 2014-03-14 DIAGNOSIS — H35039 Hypertensive retinopathy, unspecified eye: Secondary | ICD-10-CM

## 2014-03-14 DIAGNOSIS — H43819 Vitreous degeneration, unspecified eye: Secondary | ICD-10-CM

## 2014-03-14 DIAGNOSIS — E1165 Type 2 diabetes mellitus with hyperglycemia: Secondary | ICD-10-CM

## 2014-03-14 DIAGNOSIS — E11319 Type 2 diabetes mellitus with unspecified diabetic retinopathy without macular edema: Secondary | ICD-10-CM

## 2014-03-14 DIAGNOSIS — E11311 Type 2 diabetes mellitus with unspecified diabetic retinopathy with macular edema: Secondary | ICD-10-CM

## 2014-03-14 DIAGNOSIS — I10 Essential (primary) hypertension: Secondary | ICD-10-CM

## 2014-03-14 DIAGNOSIS — E1139 Type 2 diabetes mellitus with other diabetic ophthalmic complication: Secondary | ICD-10-CM

## 2014-04-05 ENCOUNTER — Emergency Department (HOSPITAL_COMMUNITY): Payer: Medicare PPO

## 2014-04-05 ENCOUNTER — Encounter (HOSPITAL_COMMUNITY): Payer: Self-pay | Admitting: Emergency Medicine

## 2014-04-05 ENCOUNTER — Inpatient Hospital Stay (HOSPITAL_COMMUNITY)
Admission: EM | Admit: 2014-04-05 | Discharge: 2014-04-17 | DRG: 280 | Disposition: A | Discharge status: Home Health | Payer: Medicare PPO | Attending: Cardiology | Admitting: Cardiology

## 2014-04-05 DIAGNOSIS — Z79899 Other long term (current) drug therapy: Secondary | ICD-10-CM | POA: Diagnosis not present

## 2014-04-05 DIAGNOSIS — N189 Chronic kidney disease, unspecified: Secondary | ICD-10-CM

## 2014-04-05 DIAGNOSIS — I161 Hypertensive emergency: Secondary | ICD-10-CM | POA: Diagnosis present

## 2014-04-05 DIAGNOSIS — E119 Type 2 diabetes mellitus without complications: Secondary | ICD-10-CM | POA: Diagnosis present

## 2014-04-05 DIAGNOSIS — I5033 Acute on chronic diastolic (congestive) heart failure: Secondary | ICD-10-CM | POA: Diagnosis present

## 2014-04-05 DIAGNOSIS — K219 Gastro-esophageal reflux disease without esophagitis: Secondary | ICD-10-CM | POA: Diagnosis present

## 2014-04-05 DIAGNOSIS — M4120 Other idiopathic scoliosis, site unspecified: Secondary | ICD-10-CM | POA: Diagnosis present

## 2014-04-05 DIAGNOSIS — I2510 Atherosclerotic heart disease of native coronary artery without angina pectoris: Secondary | ICD-10-CM | POA: Diagnosis present

## 2014-04-05 DIAGNOSIS — J9601 Acute respiratory failure with hypoxia: Secondary | ICD-10-CM | POA: Diagnosis present

## 2014-04-05 DIAGNOSIS — Z85528 Personal history of other malignant neoplasm of kidney: Secondary | ICD-10-CM

## 2014-04-05 DIAGNOSIS — Z888 Allergy status to other drugs, medicaments and biological substances status: Secondary | ICD-10-CM | POA: Diagnosis not present

## 2014-04-05 DIAGNOSIS — Z794 Long term (current) use of insulin: Secondary | ICD-10-CM

## 2014-04-05 DIAGNOSIS — M109 Gout, unspecified: Secondary | ICD-10-CM | POA: Diagnosis not present

## 2014-04-05 DIAGNOSIS — D638 Anemia in other chronic diseases classified elsewhere: Secondary | ICD-10-CM | POA: Diagnosis present

## 2014-04-05 DIAGNOSIS — H0520 Unspecified exophthalmos: Secondary | ICD-10-CM | POA: Diagnosis present

## 2014-04-05 DIAGNOSIS — E785 Hyperlipidemia, unspecified: Secondary | ICD-10-CM | POA: Diagnosis present

## 2014-04-05 DIAGNOSIS — Z9849 Cataract extraction status, unspecified eye: Secondary | ICD-10-CM | POA: Diagnosis not present

## 2014-04-05 DIAGNOSIS — I214 Non-ST elevation (NSTEMI) myocardial infarction: Principal | ICD-10-CM | POA: Diagnosis present

## 2014-04-05 DIAGNOSIS — Z950 Presence of cardiac pacemaker: Secondary | ICD-10-CM | POA: Diagnosis not present

## 2014-04-05 DIAGNOSIS — J9600 Acute respiratory failure, unspecified whether with hypoxia or hypercapnia: Secondary | ICD-10-CM | POA: Diagnosis present

## 2014-04-05 DIAGNOSIS — R5381 Other malaise: Secondary | ICD-10-CM | POA: Diagnosis not present

## 2014-04-05 DIAGNOSIS — I252 Old myocardial infarction: Secondary | ICD-10-CM | POA: Diagnosis not present

## 2014-04-05 DIAGNOSIS — I495 Sick sinus syndrome: Secondary | ICD-10-CM | POA: Diagnosis present

## 2014-04-05 DIAGNOSIS — K5900 Constipation, unspecified: Secondary | ICD-10-CM | POA: Diagnosis present

## 2014-04-05 DIAGNOSIS — I5031 Acute diastolic (congestive) heart failure: Secondary | ICD-10-CM | POA: Diagnosis present

## 2014-04-05 DIAGNOSIS — I129 Hypertensive chronic kidney disease with stage 1 through stage 4 chronic kidney disease, or unspecified chronic kidney disease: Secondary | ICD-10-CM | POA: Diagnosis present

## 2014-04-05 DIAGNOSIS — N179 Acute kidney failure, unspecified: Secondary | ICD-10-CM | POA: Diagnosis present

## 2014-04-05 DIAGNOSIS — N184 Chronic kidney disease, stage 4 (severe): Secondary | ICD-10-CM | POA: Diagnosis present

## 2014-04-05 DIAGNOSIS — M1990 Unspecified osteoarthritis, unspecified site: Secondary | ICD-10-CM | POA: Diagnosis present

## 2014-04-05 DIAGNOSIS — IMO0002 Reserved for concepts with insufficient information to code with codable children: Secondary | ICD-10-CM | POA: Diagnosis present

## 2014-04-05 DIAGNOSIS — M10372 Gout due to renal impairment, left ankle and foot: Secondary | ICD-10-CM

## 2014-04-05 DIAGNOSIS — I509 Heart failure, unspecified: Secondary | ICD-10-CM | POA: Diagnosis present

## 2014-04-05 DIAGNOSIS — D649 Anemia, unspecified: Secondary | ICD-10-CM | POA: Diagnosis present

## 2014-04-05 DIAGNOSIS — Z905 Acquired absence of kidney: Secondary | ICD-10-CM

## 2014-04-05 DIAGNOSIS — I25118 Atherosclerotic heart disease of native coronary artery with other forms of angina pectoris: Secondary | ICD-10-CM

## 2014-04-05 DIAGNOSIS — D500 Iron deficiency anemia secondary to blood loss (chronic): Secondary | ICD-10-CM

## 2014-04-05 DIAGNOSIS — Z6837 Body mass index (BMI) 37.0-37.9, adult: Secondary | ICD-10-CM | POA: Diagnosis not present

## 2014-04-05 DIAGNOSIS — J810 Acute pulmonary edema: Secondary | ICD-10-CM | POA: Diagnosis not present

## 2014-04-05 DIAGNOSIS — I10 Essential (primary) hypertension: Secondary | ICD-10-CM | POA: Diagnosis present

## 2014-04-05 HISTORY — DX: Chronic kidney disease, stage 4 (severe): N18.4

## 2014-04-05 HISTORY — DX: Anemia, unspecified: D64.9

## 2014-04-05 HISTORY — DX: Acquired absence of kidney: Z90.5

## 2014-04-05 HISTORY — DX: Acute pulmonary edema: J81.0

## 2014-04-05 LAB — CBC WITH DIFFERENTIAL/PLATELET
BASOS ABS: 0 10*3/uL (ref 0.0–0.1)
BASOS PCT: 0 % (ref 0–1)
EOS PCT: 4 % (ref 0–5)
Eosinophils Absolute: 0.3 10*3/uL (ref 0.0–0.7)
HEMATOCRIT: 34.4 % — AB (ref 36.0–46.0)
Hemoglobin: 11.8 g/dL — ABNORMAL LOW (ref 12.0–15.0)
LYMPHS PCT: 34 % (ref 12–46)
Lymphs Abs: 2.3 10*3/uL (ref 0.7–4.0)
MCH: 30.4 pg (ref 26.0–34.0)
MCHC: 34.3 g/dL (ref 30.0–36.0)
MCV: 88.7 fL (ref 78.0–100.0)
MONO ABS: 0.6 10*3/uL (ref 0.1–1.0)
Monocytes Relative: 8 % (ref 3–12)
Neutro Abs: 3.6 10*3/uL (ref 1.7–7.7)
Neutrophils Relative %: 54 % (ref 43–77)
Platelets: 271 10*3/uL (ref 150–400)
RBC: 3.88 MIL/uL (ref 3.87–5.11)
RDW: 13.3 % (ref 11.5–15.5)
WBC: 6.8 10*3/uL (ref 4.0–10.5)

## 2014-04-05 LAB — APTT: aPTT: 28 seconds (ref 24–37)

## 2014-04-05 LAB — BASIC METABOLIC PANEL
ANION GAP: 14 (ref 5–15)
BUN: 31 mg/dL — ABNORMAL HIGH (ref 6–23)
CALCIUM: 9.3 mg/dL (ref 8.4–10.5)
CO2: 25 meq/L (ref 19–32)
CREATININE: 2.21 mg/dL — AB (ref 0.50–1.10)
Chloride: 105 mEq/L (ref 96–112)
GFR calc Af Amer: 25 mL/min — ABNORMAL LOW (ref >90)
GFR calc non Af Amer: 22 mL/min — ABNORMAL LOW (ref >90)
Glucose, Bld: 64 mg/dL — ABNORMAL LOW (ref 70–99)
Potassium: 3.4 mEq/L — ABNORMAL LOW (ref 3.7–5.3)
Sodium: 144 mEq/L (ref 137–147)

## 2014-04-05 LAB — PROTIME-INR
INR: 0.97 (ref 0.00–1.49)
Prothrombin Time: 12.9 seconds (ref 11.6–15.2)

## 2014-04-05 LAB — TSH: TSH: 3.68 u[IU]/mL (ref 0.350–4.500)

## 2014-04-05 LAB — TROPONIN I
Troponin I: <0.3 ng/mL (ref <0.30)
Troponin I: 12.04 ng/mL (ref <0.30)
Troponin I: >20 ng/mL (ref <0.30)

## 2014-04-05 LAB — GLUCOSE, CAPILLARY: GLUCOSE-CAPILLARY: 233 mg/dL — AB (ref 70–99)

## 2014-04-05 LAB — CBG MONITORING, ED
GLUCOSE-CAPILLARY: 120 mg/dL — AB (ref 70–99)
Glucose-Capillary: 226 mg/dL — ABNORMAL HIGH (ref 70–99)

## 2014-04-05 LAB — PRO B NATRIURETIC PEPTIDE: Pro B Natriuretic peptide (BNP): 2112 pg/mL — ABNORMAL HIGH (ref 0–125)

## 2014-04-05 MED ORDER — DEXTROSE 50 % IV SOLN
50.0000 mL | Freq: Once | INTRAVENOUS | Status: AC
Start: 2014-04-05 — End: 2014-04-05
  Administered 2014-04-05: 50 mL via INTRAVENOUS

## 2014-04-05 MED ORDER — ONDANSETRON HCL 4 MG/2ML IJ SOLN
4.0000 mg | Freq: Four times a day (QID) | INTRAMUSCULAR | Status: DC | PRN
  Administered 2014-04-05 – 2014-04-15 (×3): 4 mg via INTRAVENOUS
  Filled 2014-04-05 (×2): qty 2

## 2014-04-05 MED ORDER — INSULIN ASPART 100 UNIT/ML ~~LOC~~ SOLN
0.0000 [IU] | Freq: Three times a day (TID) | SUBCUTANEOUS | Status: DC
  Administered 2014-04-06: 3 [IU] via SUBCUTANEOUS
  Administered 2014-04-06 – 2014-04-07 (×2): 7 [IU] via SUBCUTANEOUS
  Administered 2014-04-07: 5 [IU] via SUBCUTANEOUS
  Administered 2014-04-07 – 2014-04-08 (×2): 7 [IU] via SUBCUTANEOUS
  Administered 2014-04-08 (×2): 3 [IU] via SUBCUTANEOUS
  Administered 2014-04-09 (×2): 5 [IU] via SUBCUTANEOUS
  Administered 2014-04-09 – 2014-04-10 (×2): 3 [IU] via SUBCUTANEOUS

## 2014-04-05 MED ORDER — METOPROLOL TARTRATE 100 MG PO TABS
100.0000 mg | ORAL_TABLET | Freq: Two times a day (BID) | ORAL | Status: DC
  Administered 2014-04-05 – 2014-04-08 (×7): 100 mg via ORAL
  Filled 2014-04-05 (×7): qty 1

## 2014-04-05 MED ORDER — HEPARIN (PORCINE) IN NACL 100-0.45 UNIT/ML-% IJ SOLN
850.0000 [IU]/h | INTRAMUSCULAR | Status: DC
  Administered 2014-04-05: 850 [IU]/h via INTRAVENOUS
  Filled 2014-04-05 (×2): qty 250

## 2014-04-05 MED ORDER — NITROGLYCERIN 2 % TD OINT
1.0000 [in_us] | TOPICAL_OINTMENT | Freq: Once | TRANSDERMAL | Status: AC
  Administered 2014-04-05: 1 [in_us] via TOPICAL
  Filled 2014-04-05: qty 1

## 2014-04-05 MED ORDER — ALLOPURINOL 100 MG PO TABS
100.0000 mg | ORAL_TABLET | Freq: Every day | ORAL | Status: DC
  Administered 2014-04-05 – 2014-04-17 (×13): 100 mg via ORAL
  Filled 2014-04-05 (×13): qty 1

## 2014-04-05 MED ORDER — NITROGLYCERIN 0.4 MG SL SUBL
0.4000 mg | SUBLINGUAL_TABLET | SUBLINGUAL | Status: AC | PRN
  Administered 2014-04-05 (×3): 0.4 mg via SUBLINGUAL
  Filled 2014-04-05 (×2): qty 1

## 2014-04-05 MED ORDER — ASPIRIN EC 81 MG PO TBEC
81.0000 mg | DELAYED_RELEASE_TABLET | Freq: Every day | ORAL | Status: DC
  Administered 2014-04-06 – 2014-04-17 (×12): 81 mg via ORAL
  Filled 2014-04-05 (×12): qty 1

## 2014-04-05 MED ORDER — NITROGLYCERIN IN D5W 200-5 MCG/ML-% IV SOLN
2.0000 ug/min | INTRAVENOUS | Status: DC
  Administered 2014-04-05: 2 ug/min via INTRAVENOUS
  Administered 2014-04-06: 60 ug/min via INTRAVENOUS
  Administered 2014-04-07: 50 ug/min via INTRAVENOUS
  Administered 2014-04-07: 80 ug/min via INTRAVENOUS
  Administered 2014-04-09: 10 ug/min via INTRAVENOUS
  Filled 2014-04-05 (×6): qty 250

## 2014-04-05 MED ORDER — PROMETHAZINE HCL 25 MG/ML IJ SOLN: 12.5000 mg | Freq: Three times a day (TID) | INTRAMUSCULAR | Status: DC | PRN

## 2014-04-05 MED ORDER — SODIUM CHLORIDE 0.9 % IJ SOLN
3.0000 mL | Freq: Two times a day (BID) | INTRAMUSCULAR | Status: DC
  Administered 2014-04-06: 3 mL via INTRAVENOUS
  Administered 2014-04-06: 22:00:00 via INTRAVENOUS
  Administered 2014-04-07 – 2014-04-17 (×20): 3 mL via INTRAVENOUS

## 2014-04-05 MED ORDER — DEXTROSE 50 % IV SOLN
INTRAVENOUS | Status: AC
  Filled 2014-04-05: qty 50

## 2014-04-05 MED ORDER — SODIUM CHLORIDE 0.9 % IJ SOLN: 3.0000 mL | INTRAMUSCULAR | Status: DC | PRN

## 2014-04-05 MED ORDER — HEPARIN BOLUS VIA INFUSION
4000.0000 [IU] | Freq: Once | INTRAVENOUS | Status: AC
  Administered 2014-04-05: 4000 [IU] via INTRAVENOUS

## 2014-04-05 MED ORDER — NITROGLYCERIN 0.4 MG SL SUBL: 0.4000 mg | SUBLINGUAL_TABLET | SUBLINGUAL | Status: DC | PRN

## 2014-04-05 MED ORDER — ACETAMINOPHEN 500 MG PO TABS
1000.0000 mg | ORAL_TABLET | Freq: Once | ORAL | Status: AC
  Administered 2014-04-05: 1000 mg via ORAL

## 2014-04-05 MED ORDER — SODIUM CHLORIDE 0.9 % IV SOLN: 250.0000 mL | INTRAVENOUS | Status: DC | PRN

## 2014-04-05 MED ORDER — ACETAMINOPHEN 325 MG PO TABS
650.0000 mg | ORAL_TABLET | ORAL | Status: DC | PRN
  Administered 2014-04-06 – 2014-04-14 (×4): 650 mg via ORAL
  Filled 2014-04-05 (×5): qty 2

## 2014-04-05 MED ORDER — CLONIDINE HCL 0.3 MG PO TABS
0.3000 mg | ORAL_TABLET | Freq: Three times a day (TID) | ORAL | Status: DC
  Administered 2014-04-05 – 2014-04-08 (×9): 0.3 mg via ORAL
  Filled 2014-04-05 (×10): qty 1

## 2014-04-05 MED ORDER — ONDANSETRON HCL 4 MG/2ML IJ SOLN
INTRAMUSCULAR | Status: AC
  Filled 2014-04-05: qty 2

## 2014-04-05 MED ORDER — ACETAMINOPHEN 500 MG PO TABS
ORAL_TABLET | ORAL | Status: AC
  Administered 2014-04-05: 1000 mg via ORAL
  Filled 2014-04-05: qty 2

## 2014-04-05 MED ORDER — PANTOPRAZOLE SODIUM 40 MG PO TBEC
40.0000 mg | DELAYED_RELEASE_TABLET | Freq: Two times a day (BID) | ORAL | Status: DC
  Administered 2014-04-05 – 2014-04-16 (×22): 40 mg via ORAL
  Filled 2014-04-05 (×21): qty 1

## 2014-04-05 MED ORDER — ONDANSETRON HCL 4 MG/2ML IJ SOLN
4.0000 mg | Freq: Once | INTRAMUSCULAR | Status: AC
  Administered 2014-04-05: 4 mg via INTRAMUSCULAR
  Filled 2014-04-05: qty 2

## 2014-04-05 MED ORDER — ASPIRIN 325 MG PO TABS
325.0000 mg | ORAL_TABLET | Freq: Once | ORAL | Status: AC
  Administered 2014-04-05: 325 mg via ORAL
  Filled 2014-04-05: qty 1

## 2014-04-05 MED ORDER — FUROSEMIDE 10 MG/ML IJ SOLN
40.0000 mg | Freq: Once | INTRAMUSCULAR | Status: AC
  Administered 2014-04-05: 40 mg via INTRAVENOUS
  Filled 2014-04-05: qty 4

## 2014-04-05 MED ORDER — MORPHINE SULFATE 4 MG/ML IJ SOLN
4.0000 mg | Freq: Once | INTRAMUSCULAR | Status: AC
  Administered 2014-04-05: 4 mg via INTRAVENOUS
  Filled 2014-04-05: qty 1

## 2014-04-05 NOTE — ED Notes (Signed)
Patient left ED at this time with [facility] staff.

## 2014-04-05 NOTE — Consult Note (Signed)
Primary cardiologist: Dr Johnny Bridge Consulting cardiologist: Dr Carlyle Dolly  Clinical Summary Alyssa [name] is a 68 y.o.female history of sick sinus syndrome with Medtronic pacemaker,mild non-obstructive CAD by cath 2007, DM2, HTN, hyperlipidemia, renal cell CA with prior left nephrectomy admitted with chest pain.   Reports episodes of left chest pain, arm pain, and jaw pain on and off overnight. + SOB. Has had similar symptoms previously but not this intense or long in duration. Reports strict compliance with medications, including her antihypertensives.   Cath 2007 mild non-obstructive disease. Echo Jan 2015 LVEF 50-55% with mild anteroseptal hypokinesis, abnormal diastolic function. 08/2013 MPI low risk, possible breast attenuation.  ER vitals 224/84 (MAP 130) p 68, bp down to 198/77 (MAP 117) EKG V-paced CXR mild pulm edema INR 0.9, pro-BNP 2112, trop 0.3--> 12, K 3.4, Cr 2.21, GFR 22, Hgb 11.8  Allergies  Allergen Reactions  . Simvastatin   . Verapamil     Medications Scheduled Medications:     Infusions: . heparin 850 Units/hr (04/05/14 1224)     PRN Medications:     Past Medical History  Diagnosis Date  . Sick sinus syndrome 2004    Medtronic PPM 10/2004 - Dr. Lovena Le  . Coronary atherosclerosis of native coronary artery     Nonobstructive; 30-40% LAD, Cx, and RCA in 2007; normal EF by echo in 2006; nl stress nuclear in 2008  . Chronic diastolic heart failure   . Type 2 diabetes mellitus   . Essential hypertension, benign   . Hyperlipidemia   . Renal cell carcinoma   . GERD (gastroesophageal reflux disease)   . Allergic rhinitis   . Low back pain     Scoliosis and degenerative disc disease with  . Osteoarthritis   . Gout   . Hx of colonic polyps   . CKD (chronic kidney disease) stage 2, GFR 60-89 ml/min     Past Surgical History  Procedure Laterality Date  . Cataract extraction    . Tonsillectomy    . Cholecystectomy      Approx. 1995  .  Abdominal hysterectomy      1974  . Nephrectomy      Left nephrectomy- renal cell carcinoma  . Knee surgery    . Insert / replace / remove pacemaker      Medtronic Dual chamber pacemaker 11/11/2004  . Back surgery      Family History  Problem Relation Age of Onset  . Colon polyps Mother 47  . Coronary artery disease Mother   . Breast cancer Other   . Ovarian cancer Other   . Uterine cancer Other   . Heart attack Father     Social History Ms. Roesler reports that she has never smoked. She has never used smokeless tobacco. Ms. Flippo reports that she does not drink alcohol.  Review of Systems CONSTITUTIONAL: No weight loss, fever, chills, weakness or fatigue.  HEENT: Eyes: No visual loss, blurred vision, double vision or yellow sclerae. No hearing loss, sneezing, congestion, runny nose or sore throat.  SKIN: No rash or itching.  CARDIOVASCULAR: per HPI RESPIRATORY: No shortness of breath, cough or sputum.  GASTROINTESTINAL: No anorexia, nausea, vomiting or diarrhea. No abdominal pain or blood.  GENITOURINARY: no polyuria, no dysuria NEUROLOGICAL: No headache, dizziness, syncope, paralysis, ataxia, numbness or tingling in the extremities. No change in bowel or bladder control.  MUSCULOSKELETAL: left arm pain HEMATOLOGIC: No anemia, bleeding or bruising.  LYMPHATICS: No enlarged nodes. No history of splenectomy.  PSYCHIATRIC: No history of depression or anxiety.      Physical Examination Blood pressure 198/77, pulse 61, temperature 98.8 F (37.1 C), temperature source Oral, resp. rate 20, height 5\' 4"  (1.626 m), weight 217 lb (98.431 kg), SpO2 100.00%.  Intake/Output Summary (Last 24 hours) at 04/05/14 1227 Last data filed at 04/05/14 1133  Gross per 24 hour  Intake      0 ml  Output    500 ml  Net   -500 ml    HEENT: sclera clear  Cardiovascular: RRR, no m/r/g, no JVD  Respiratory: CTAB  GI: abdomen soft, NT, ND  MSK: no LE edema  Neuro: no focal  deficits  Psych: appropriate affect   Lab Results  Basic Metabolic Panel:  Recent Labs Lab 04/05/14 0749  NA 144  K 3.4*  CL 105  CO2 25  GLUCOSE 64*  BUN 31*  CREATININE 2.21*  CALCIUM 9.3    Liver Function Tests: No results found for this basename: AST, ALT, ALKPHOS, BILITOT, PROT, ALBUMIN,  in the last 168 hours  CBC:  Recent Labs Lab 04/05/14 0749  WBC 6.8  NEUTROABS 3.6  HGB 11.8*  HCT 34.4*  MCV 88.7  PLT 271    Cardiac Enzymes:  Recent Labs Lab 04/05/14 0749 04/05/14 1046  TROPONINI <0.30 12.04*    BNP: No components found with this basename: POCBNP,    ECG   Imaging Jan 2007 Cath Left main coronary artery had 20% discrete stenosis.  Left anterior descending artery had 40% tubular disease proximally. The  first diagonal Elias Bordner had 30% discrete lesions.  Distal LAD was normal. The ostium of the circumflex coronary artery had a  30% discrete lesion. Mid and distal circ and OM were normal.  Right coronary artery was large. Proximal mid and distal vessel was normal.  There were 30% mild discrete lesions in the PDA and PLA.  The patient only has one kidney and her creatinine was 1.4. She was hydrated  prior to the case. We did not do an LV gram. Forty-five cc of contrast was  used  The patient will be discharged later today so long as her groin heals well.  She will follow up at [facility] in Dr. Baird Cancer office for a  creatinine on Wednesday.   Jan 2015 Echo Study Conclusions  - Study data: Technically difficult study. - Left ventricle: The cavity size was normal. Wall thickness was increased in a pattern of severe LVH. Systolic function was low normal. The estimated ejection fraction was in the range of 50% to 55%. Findings consistent with left ventricular diastolic dysfunction, indeterminate grade. There is evidence of very elevated left atrial pressure (E/e' 28) - Regional wall motion abnormality: Mild hypokinesis of  the mid anteroseptal myocardium. - Aortic valve: Moderately calcified annulus. Trileaflet; mildly thickened leaflets. Valve area: 1.7cm^2(VTI). Valve area: 1.63cm^2 (Vmax). - Mitral valve: Moderately calcified annulus. Mildly thickened leaflets . Mild regurgitation. - Left atrium: The atrium was severely dilated. - Right ventricle: The cavity size was mildly dilated. - Right atrium: The atrium was mildly dilated. - Pulmonary arteries: PA peak pressure: 18mm Hg (S).  08/2013 MPI Tomographic views were obtained using the short axis, vertical long  axis, and horizontal long axis planes. There is a small, mild to  moderate intensity, apical anteroseptal defect most consistent with  soft tissue attenuation, less likely scar. No ischemic defects are  noted.  Gated imaging reveals an EDV of 106, ESV of 61, TID ratio 1.06,  and  he LVEF calculated at 42% with possible periapical and inferior  apical dyskinesis, although this may be due to ventricular pacing  with abnormal apical and inferoseptal motion.  IMPRESSION:  Overall low risk Lexiscan Cardiolite. ECG was not interpretable for  ischemic changes due to ventricular pacing throughout. There were  occasional PVCs noted. Perfusion imaging is most consistent with  breast attenuation, less likely scar affecting the apical  anteroseptal wall. LV volumes are normal. LVEF is calculated at 42%  with possible wall motion abnormalities in the inferior apical  segment, although suspect this may be related to ventricular pacing.  Recent echocardiogram revealed and LVEF of 50-55% without obvious  apical wall motion abnormality.    Assessment/Plan 1. Hypertensive emergency/ NSTEMI - severely elevated blood pressures, systolic bp confirmed at 500. Patient presents with evidence of NSTEMI and mild heart failure - will initiate NG drip, baseline MAP 120, goal will be to decrease 10% in first hour to 110 and then 25% within first 24 hours to 90 mmHg.  Instructions on lower bp communicated to ER nursing staff and written in epic.  - will stop heparin until bp better controlled - recommend transfer to [facility] for further management, with possible consideration for cath given the magnitude of her troponin elevation. Her renal function will put her at higher risk for AKI - continue ASA, hold oral bp meds while on NG drip. Start high dose statin.   Carlyle Dolly, M.D., F.A.C.C.

## 2014-04-05 NOTE — ED Notes (Signed)
[facility] at bedside 

## 2014-04-05 NOTE — ED Provider Notes (Signed)
CSN: [id number]     Arrival date & time 04/05/14  0740 History   First MD Initiated Contact with Patient 04/05/14 0747     Chief Complaint  Patient presents with  . Chest Pain     (Consider location/radiation/quality/duration/timing/severity/associated sxs/prior Treatment) HPI 68 year old female with history of coronary artery disease, heart failure, sick sinus syndrome with pacemaker, most recent ejection fraction within the last several months 55%, low risk nuclear medicine Myoview scan within the last several months performed for a year of intermittent chest pain episodes, patient now presents with chest pain syndrome that started about 10 hours prior to arrival with gradual onset resting mild ache to her left upper chest and left upper arm and now for about 4 hours has radiation of the mild ache to her neck and jaw with mild shortness of breath but no syncope no palpitations no fever no cough no severe shortness of breath no abdominal pain no back pain no nausea vomiting and no other concerns. Her pain is not sudden sharp stabbing in nothing seems to make it better or worse. Her pain is gradually worsening over the last 4 hours or so. Patient took 3 sublingual nitroglycerin prior to arrival without improvement. Past Medical History  Diagnosis Date  . Sick sinus syndrome 2004    Medtronic PPM 10/2004 - Dr. Lovena Le  . Coronary atherosclerosis of native coronary artery     Nonobstructive; 30-40% LAD, Cx, and RCA in 2007; normal EF by echo in 2006; nl stress nuclear in 2008  . Chronic diastolic heart failure   . Type 2 diabetes mellitus   . Essential hypertension, benign   . Hyperlipidemia   . Renal cell carcinoma   . GERD (gastroesophageal reflux disease)   . Allergic rhinitis   . Low back pain     Scoliosis and degenerative disc disease with  . Osteoarthritis   . Gout   . Hx of colonic polyps   . CKD (chronic kidney disease) stage 2, GFR 60-89 ml/min    Past Surgical History   Procedure Laterality Date  . Cataract extraction    . Tonsillectomy    . Cholecystectomy      Approx. 1995  . Abdominal hysterectomy      1974  . Nephrectomy      Left nephrectomy- renal cell carcinoma  . Knee surgery    . Insert / replace / remove pacemaker      Medtronic Dual chamber pacemaker 11/11/2004  . Back surgery     Family History  Problem Relation Age of Onset  . Colon polyps Mother 38  . Coronary artery disease Mother   . Breast cancer Other   . Ovarian cancer Other   . Uterine cancer Other   . Heart attack Father    History  Substance Use Topics  . Smoking status: Never Smoker   . Smokeless tobacco: Never Used  . Alcohol Use: No   OB History   Grav Para Term Preterm Abortions TAB SAB Ect Mult Living                 Review of Systems 10 Systems reviewed and are negative for acute change except as noted in the HPI.   Allergies  Simvastatin and Verapamil  Home Medications   Prior to Admission medications   Medication Sig Start Date End Date Taking? Authorizing Provider  allopurinol (ZYLOPRIM) 100 MG tablet Take 100 mg by mouth daily.     Yes Historical Provider, MD  cloNIDine (  CATAPRES) 0.3 MG tablet Take 0.3 mg by mouth 3 (three) times daily.     Yes Historical Provider, MD  diltiazem (CARDIZEM) 120 MG tablet 120 mg daily.    Yes Historical Provider, MD  HUMALOG MIX 75/25 (75-25) 100 UNIT/ML SUSP Inject 15-55 Units into the skin 2 (two) times daily with a meal. 55 units in the am and 15 in evening depending on CBG reading 11/12/11  Yes Historical Provider, MD  metoprolol (LOPRESSOR) 100 MG tablet Take 100 mg by mouth 2 (two) times daily.  07/27/11  Yes Yehuda Savannah, MD  Olmesartan-Amlodipine-HCTZ [facility]) 40-5-25 MG TABS Take 1 tablet by mouth daily.     Yes Historical Provider, MD  omeprazole (PRILOSEC) 20 MG capsule Take 20 mg by mouth 2 (two) times daily before a meal.  01/15/12  Yes Historical Provider, MD  tiZANidine (ZANAFLEX) 4 MG tablet  Take 4 mg by mouth daily as needed for muscle spasms.  07/21/13  Yes Historical Provider, MD   BP 188/75  Pulse 61  Temp(Src) 98.1 F (36.7 C) (Oral)  Resp 19  Ht 5\' 4"  (1.626 m)  Wt 215 lb 6.2 oz (97.7 kg)  BMI 36.95 kg/m2  SpO2 98% Physical Exam  Nursing note and vitals reviewed. Constitutional:  Awake, alert, nontoxic appearance.  HENT:  Head: Atraumatic.  Eyes: Right eye exhibits no discharge. Left eye exhibits no discharge.  Neck: Neck supple.  Cardiovascular: Normal rate and regular rhythm.   No murmur heard. Pulmonary/Chest: Effort normal and breath sounds normal. No respiratory distress. She has no wheezes. She has no rales. She exhibits no tenderness.  Abdominal: Soft. Bowel sounds are normal. She exhibits no distension. There is no tenderness. There is no rebound and no guarding.  Musculoskeletal: She exhibits no edema and no tenderness.  Baseline ROM, no obvious new focal weakness.  Neurological: She is alert.  Mental status and motor strength appears baseline for patient and situation.  Skin: No rash noted.  Psychiatric: She has a normal mood and affect.    ED Course  Procedures (including critical care time) Pain free in ED but dyspnea with using commode ordered Lasix and NTG paste; initial trop negative, but repeat trop elevated NSTEMI Cards paged and heparin ordered. Cards will see Pt in ED. 1215  Pt feels improved after observation and/or treatment in ED.Patient / Family / Caregiver informed of clinical course, understand medical decision-making process, and agree with plan.  Pt accepted by [place] at Echo, but no beds available yet. [place] Performed by: Babette Relic Total critical care time: 3min Critical care time was exclusive of separately billable procedures and treating other patients. Critical care was necessary to treat or prevent imminent or life-threatening deterioration. Critical care was time spent personally by me on the  following activities: development of treatment plan with patient and/or surrogate as well as nursing, discussions with consultants, evaluation of patient's response to treatment, examination of patient, obtaining history from patient or surrogate, ordering and performing treatments and interventions, ordering and review of laboratory studies, ordering and review of radiographic studies, pulse oximetry and re-evaluation of patient's condition.  Labs Review Labs Reviewed  CBC WITH DIFFERENTIAL - Abnormal; Notable for the following:    Hemoglobin 11.8 (*)    HCT 34.4 (*)    All other components within normal limits  BASIC METABOLIC PANEL - Abnormal; Notable for the following:    Potassium 3.4 (*)    Glucose, Bld 64 (*)    BUN  31 (*)    Creatinine, Ser 2.21 (*)    GFR calc non Af Amer 22 (*)    GFR calc Af Amer 25 (*)    All other components within normal limits  TROPONIN I - Abnormal; Notable for the following:    Troponin I 12.04 (*)    All other components within normal limits  PRO B NATRIURETIC PEPTIDE - Abnormal; Notable for the following:    Pro B Natriuretic peptide (BNP) 2112.0 (*)    All other components within normal limits  TROPONIN I - Abnormal; Notable for the following:    Troponin I >20.00 (*)    All other components within normal limits  TROPONIN I - Abnormal; Notable for the following:    Troponin I 13.18 (*)    All other components within normal limits  TROPONIN I - Abnormal; Notable for the following:    Troponin I >20.00 (*)    All other components within normal limits  CBC - Abnormal; Notable for the following:    RBC 3.37 (*)    Hemoglobin 10.1 (*)    HCT 29.7 (*)    All other components within normal limits  BASIC METABOLIC PANEL - Abnormal; Notable for the following:    Glucose, Bld 350 (*)    BUN 29 (*)    Creatinine, Ser 2.31 (*)    GFR calc non Af Amer 21 (*)    GFR calc Af Amer 24 (*)    All other components within normal limits  LIPID PANEL -  Abnormal; Notable for the following:    Cholesterol 239 (*)    Triglycerides 234 (*)    HDL 32 (*)    VLDL 47 (*)    LDL Cholesterol 160 (*)    All other components within normal limits  HEPATIC FUNCTION PANEL - Abnormal; Notable for the following:    Albumin 2.9 (*)    AST 79 (*)    All other components within normal limits  GLUCOSE, CAPILLARY - Abnormal; Notable for the following:    Glucose-Capillary 233 (*)    All other components within normal limits  GLUCOSE, CAPILLARY - Abnormal; Notable for the following:    Glucose-Capillary 319 (*)    All other components within normal limits  BASIC METABOLIC PANEL - Abnormal; Notable for the following:    Potassium 3.6 (*)    Glucose, Bld 212 (*)    BUN 32 (*)    Creatinine, Ser 2.60 (*)    Calcium 8.1 (*)    GFR calc non Af Amer 18 (*)    GFR calc Af Amer 21 (*)    All other components within normal limits  GLUCOSE, CAPILLARY - Abnormal; Notable for the following:    Glucose-Capillary 212 (*)    All other components within normal limits  GLUCOSE, CAPILLARY - Abnormal; Notable for the following:    Glucose-Capillary 106 (*)    All other components within normal limits  CBG MONITORING, ED - Abnormal; Notable for the following:    Glucose-Capillary 120 (*)    All other components within normal limits  CBG MONITORING, ED - Abnormal; Notable for the following:    Glucose-Capillary 226 (*)    All other components within normal limits  TROPONIN I  APTT  PROTIME-INR  TSH  HEPARIN LEVEL (UNFRACTIONATED)  CBC  HEPARIN LEVEL (UNFRACTIONATED)    Imaging Review Dg Chest Portable 1 View  04/05/2014   CLINICAL DATA:  Chest pain.  Short of breath.  EXAM: PORTABLE CHEST - 1 VIEW  COMPARISON:  04/29/2012  FINDINGS: Previous spinal fusion. Dual lead pacemaker with leads in the region of the right atrium and right ventricle. Heart at the upper limits of normal in size. Venous hypertension with interstitial pulmonary edema. No effusions.   IMPRESSION: Interstitial pulmonary edema.   Electronically Signed   By: Nelson Chimes M.D.   On: 04/05/2014 08:21     EKG Interpretation   Date/Time:  Thursday April 05 2014 07:47:25 EDT Ventricular Rate:  74 PR Interval:  140 QRS Duration: 176 QT Interval:  472 QTC Calculation: 523 R Axis:   -39 Text Interpretation:  Atrial-sensed ventricular-paced rhythm No  significant change since last tracing Confirmed by [facility]  MD, Jenny Reichmann  [phone]) on 04/05/2014 4:43:58 PM     ECG: Atrial sensed ventricular paced rhythm with ventricular rate 74, no comparison ECG immediately available MDM   Final diagnoses:  NSTEMI (non-ST elevated myocardial infarction)  Acute diastolic heart failure    The patient appears reasonably stabilized for transfer considering the current resources, flow, and capabilities available in the ED at this time, and I doubt any other [facility] requiring further screening and/or treatment in the ED prior to transfer.    Babette Relic, MD 04/06/14 [phone]

## 2014-04-05 NOTE — ED Notes (Signed)
Report given to [employer].

## 2014-04-05 NOTE — ED Notes (Signed)
Admitting MD gave RN verbal orders for d/c of Heparin.

## 2014-04-05 NOTE — Progress Notes (Addendum)
Noted that patient needs finished orders. Discussed plan for BP meds with Dr. Debara Pickett. We have elected to continue home clonidine (to not precipitate greater rebound HTN) and metoprolol (for MI). Will hold off Tribenzor and Diltiazem while on NTG drip per prior note. Add SSI PRN. Hold regular scheduled insulin while we are still making decisions about plan for cath at some point. Will keep NPO after midnight. I stopped by to check on pt - No further CP. She has had intermittent nausea without good response to Zofran thus will order PRN phenergan. With exophtalmos appearance will also check TSH. Per Dr. Nelly Laurence note, holding heparin until BP better controlled. Continue to cycle enzymes. Dayna Dunn PA-C

## 2014-04-05 NOTE — ED Notes (Signed)
CRITICAL VALUE ALERT  Critical value received:  troponin  Date of notification:  04/05/14  Time of notification:  1120  Critical value read back:Yes.    Nurse who received alert:  Jeanice Lim, RN  MD notified (1st page):  [place]  Time of first page:  1120

## 2014-04-05 NOTE — Progress Notes (Signed)
Pt admitted to 2M07 from [facility].  Pt transferred to bed, Alert and oriented on admission. Report given to [facility].  Will monitor.

## 2014-04-05 NOTE — ED Notes (Signed)
Pt reports was having some left arm pain when she went to bed last night.  Woke up at 4 am with generalized weakness, SOB, and pain in left arm, chest, gums, and neck.   Pt says her chest hurts around her pacemaker.

## 2014-04-05 NOTE — ED Notes (Signed)
Tylenol given for headache.

## 2014-04-05 NOTE — Progress Notes (Signed)
ANTICOAGULATION CONSULT NOTE - Initial Consult  Pharmacy Consult for Heparin Indication: chest pain/ACS  Allergies  Allergen Reactions  . Simvastatin   . Verapamil    Patient Measurements: Height: 5\' 4"  (162.6 cm) Weight: 217 lb (98.431 kg) IBW/kg (Calculated) : 54.7 Heparin Dosing Weight: 72.2Kg  Vital Signs: Temp: 98.8 F (37.1 C) (07/16 0744) Temp src: Oral (07/16 0744) BP: 198/77 mmHg (07/16 0827) Pulse Rate: 61 (07/16 0827)  Labs:  Recent Labs  04/05/14 0749 04/05/14 1046  HGB 11.8*  --   HCT 34.4*  --   PLT 271  --   APTT 28  --   LABPROT 12.9  --   INR 0.97  --   CREATININE 2.21*  --   TROPONINI <0.30 12.04*   Estimated Creatinine Clearance: 27.8 ml/min (by C-G formula based on Cr of 2.21).  Medical History: Past Medical History  Diagnosis Date  . Sick sinus syndrome 2004    Medtronic PPM 10/2004 - Dr. Lovena Le  . Coronary atherosclerosis of native coronary artery     Nonobstructive; 30-40% LAD, Cx, and RCA in 2007; normal EF by echo in 2006; nl stress nuclear in 2008  . Chronic diastolic heart failure   . Type 2 diabetes mellitus   . Essential hypertension, benign   . Hyperlipidemia   . Renal cell carcinoma   . GERD (gastroesophageal reflux disease)   . Allergic rhinitis   . Low back pain     Scoliosis and degenerative disc disease with  . Osteoarthritis   . Gout   . Hx of colonic polyps   . CKD (chronic kidney disease) stage 2, GFR 60-89 ml/min    Medications:   (Not in a hospital admission)  Assessment: 68yo female admitted with CP.  Asked to initiate IV Heparin for ACS.   Goal of Therapy:  Heparin level 0.3-0.7 units/ml Monitor platelets by anticoagulation protocol: Yes   Plan:  Heparin 4000 units IV bolus x 1 now Heparin infusion at 12 units/Kg/Hr (using ADJ bw) Heparin level in 6-8 hours then daily CBC daily while on Heparin  [place] Crane, Dreydon Cardenas A 04/05/2014,12:01 PM

## 2014-04-06 ENCOUNTER — Encounter (INDEPENDENT_AMBULATORY_CARE_PROVIDER_SITE_OTHER): Payer: Medicare PPO | Admitting: Ophthalmology

## 2014-04-06 DIAGNOSIS — I059 Rheumatic mitral valve disease, unspecified: Secondary | ICD-10-CM

## 2014-04-06 LAB — BASIC METABOLIC PANEL
Anion gap: 15 (ref 5–15)
Anion gap: 14 (ref 5–15)
BUN: 29 mg/dL — AB (ref 6–23)
BUN: 32 mg/dL — AB (ref 6–23)
CALCIUM: 8.5 mg/dL (ref 8.4–10.5)
CO2: 24 mEq/L (ref 19–32)
CO2: 27 mEq/L (ref 19–32)
CREATININE: 2.6 mg/dL — AB (ref 0.50–1.10)
Calcium: 8.1 mg/dL — ABNORMAL LOW (ref 8.4–10.5)
Chloride: 100 mEq/L (ref 96–112)
Chloride: 99 mEq/L (ref 96–112)
Creatinine, Ser: 2.31 mg/dL — ABNORMAL HIGH (ref 0.50–1.10)
GFR calc Af Amer: 24 mL/min — ABNORMAL LOW (ref >90)
GFR, EST AFRICAN AMERICAN: 21 mL/min — AB (ref >90)
GFR, EST NON AFRICAN AMERICAN: 21 mL/min — AB (ref >90)
GFR, EST NON AFRICAN AMERICAN: 18 mL/min — AB (ref >90)
GLUCOSE: 350 mg/dL — AB (ref 70–99)
Glucose, Bld: 212 mg/dL — ABNORMAL HIGH (ref 70–99)
Potassium: 3.7 mEq/L (ref 3.7–5.3)
Potassium: 3.6 mEq/L — ABNORMAL LOW (ref 3.7–5.3)
SODIUM: 139 meq/L (ref 137–147)
Sodium: 140 mEq/L (ref 137–147)

## 2014-04-06 LAB — LIPID PANEL
Cholesterol: 239 mg/dL — ABNORMAL HIGH (ref 0–200)
HDL: 32 mg/dL — ABNORMAL LOW (ref >39)
LDL Cholesterol: 160 mg/dL — ABNORMAL HIGH (ref 0–99)
Total CHOL/HDL Ratio: 7.5 RATIO
Triglycerides: 234 mg/dL — ABNORMAL HIGH (ref <150)
VLDL: 47 mg/dL — AB (ref 0–40)

## 2014-04-06 LAB — CBC
HEMATOCRIT: 29.7 % — AB (ref 36.0–46.0)
HEMOGLOBIN: 10.1 g/dL — AB (ref 12.0–15.0)
MCH: 30 pg (ref 26.0–34.0)
MCHC: 34 g/dL (ref 30.0–36.0)
MCV: 88.1 fL (ref 78.0–100.0)
Platelets: 258 10*3/uL (ref 150–400)
RBC: 3.37 MIL/uL — ABNORMAL LOW (ref 3.87–5.11)
RDW: 13.4 % (ref 11.5–15.5)
WBC: 10.2 10*3/uL (ref 4.0–10.5)

## 2014-04-06 LAB — HEPARIN LEVEL (UNFRACTIONATED): Heparin Unfractionated: 0.33 IU/mL (ref 0.30–0.70)

## 2014-04-06 LAB — GLUCOSE, CAPILLARY
Glucose-Capillary: 319 mg/dL — ABNORMAL HIGH (ref 70–99)
Glucose-Capillary: 212 mg/dL — ABNORMAL HIGH (ref 70–99)
Glucose-Capillary: 106 mg/dL — ABNORMAL HIGH (ref 70–99)
Glucose-Capillary: 141 mg/dL — ABNORMAL HIGH (ref 70–99)

## 2014-04-06 LAB — HEPATIC FUNCTION PANEL
ALBUMIN: 2.9 g/dL — AB (ref 3.5–5.2)
ALT: 23 U/L (ref 0–35)
AST: 79 U/L — ABNORMAL HIGH (ref 0–37)
Alkaline Phosphatase: 87 U/L (ref 39–117)
Bilirubin, Direct: <0.2 mg/dL (ref 0.0–0.3)
Total Bilirubin: 0.3 mg/dL (ref 0.3–1.2)
Total Protein: 6.6 g/dL (ref 6.0–8.3)

## 2014-04-06 LAB — TROPONIN I
TROPONIN I: 13.18 ng/mL — AB (ref <0.30)
Troponin I: >20 ng/mL (ref <0.30)

## 2014-04-06 MED ORDER — BIOTENE DRY MOUTH MT LIQD
15.0000 mL | Freq: Two times a day (BID) | OROMUCOSAL | Status: DC
  Administered 2014-04-06 – 2014-04-17 (×21): 15 mL via OROMUCOSAL

## 2014-04-06 MED ORDER — HEPARIN (PORCINE) IN NACL 100-0.45 UNIT/ML-% IJ SOLN
1300.0000 [IU]/h | INTRAMUSCULAR | Status: DC
  Administered 2014-04-06: 1000 [IU]/h via INTRAVENOUS
  Administered 2014-04-07: 1300 [IU]/h via INTRAVENOUS
  Filled 2014-04-06 (×3): qty 250

## 2014-04-06 MED ORDER — SODIUM CHLORIDE 0.9 % IV SOLN
INTRAVENOUS | Status: DC
  Administered 2014-04-06: 1000 mL via INTRAVENOUS
  Administered 2014-04-07: via INTRAVENOUS

## 2014-04-06 MED ORDER — MORPHINE SULFATE 2 MG/ML IJ SOLN
2.0000 mg | INTRAMUSCULAR | Status: DC | PRN
  Administered 2014-04-06 – 2014-04-09 (×7): 2 mg via INTRAVENOUS
  Filled 2014-04-06 (×7): qty 1

## 2014-04-06 MED ORDER — HEPARIN BOLUS VIA INFUSION
4000.0000 [IU] | Freq: Once | INTRAVENOUS | Status: AC
  Administered 2014-04-06: 4000 [IU] via INTRAVENOUS
  Filled 2014-04-06: qty 4000

## 2014-04-06 MED ORDER — PERFLUTREN LIPID MICROSPHERE
1.0000 mL | INTRAVENOUS | Status: AC | PRN
  Administered 2014-04-06: 2 mL via INTRAVENOUS
  Filled 2014-04-06: qty 10

## 2014-04-06 MED ORDER — HYDRALAZINE HCL 20 MG/ML IJ SOLN
20.0000 mg | INTRAMUSCULAR | Status: DC | PRN
  Administered 2014-04-06 – 2014-04-07 (×3): 20 mg via INTRAVENOUS
  Filled 2014-04-06 (×3): qty 1

## 2014-04-06 MED FILL — Hydralazine HCl Inj 20 MG/ML: INTRAMUSCULAR | Qty: 1 | Status: AC

## 2014-04-06 NOTE — Progress Notes (Signed)
Echocardiogram 2D Echocardiogram with Definity has been performed.  Alyssa [name] 04/06/2014, 3:51 PM

## 2014-04-06 NOTE — Progress Notes (Addendum)
Patient ID: Alyssa [name], female   DOB: [birth year], 68 y.o.   MRN: [id number]    Subjective:  Denies SSCP, palpitations or Dyspnea Feels much better with nausea also resolved   Objective:  Filed Vitals:   04/06/14 1000 04/06/14 1015 04/06/14 1030 04/06/14 1045  BP: 167/58  125/51   Pulse: 61 59 59 59  Temp:      TempSrc:      Resp: 19 13 13 13   Height:      Weight:      SpO2: 100% 100% 100% 100%    Intake/Output from previous day:  Intake/Output Summary (Last 24 hours) at 04/06/14 1106 Last data filed at 04/06/14 1000  Gross per 24 hour  Intake 438.83 ml  Output   1675 ml  Net -1236.17 ml    Physical Exam: Affect appropriate Obese black female HEENT: Exopthalmous  Neck supple with no adenopathy JVP normal no bruits no thyromegaly Lungs clear with no wheezing and good diaphragmatic motion Heart:  S1/S2 no murmur, no rub, gallop or click PMI normal Abdomen: benighn, BS positve, no tenderness, no AAA no bruit.  No HSM or HJR Distal pulses intact with no bruits No edema Neuro non-focal Skin warm and dry No muscular weakness   Lab Results: Basic Metabolic Panel:  Recent Labs  04/05/14 0749 04/06/14 0200  NA 144 139  K 3.4* 3.7  CL 105 100  CO2 25 24  GLUCOSE 64* 350*  BUN 31* 29*  CREATININE 2.21* 2.31*  CALCIUM 9.3 8.5   Liver Function Tests:  Recent Labs  04/06/14 0200  AST 79*  ALT 23  ALKPHOS 87  BILITOT 0.3  PROT 6.6  ALBUMIN 2.9*   CBC:  Recent Labs  04/05/14 0749 04/06/14 0200  WBC 6.8 10.2  NEUTROABS 3.6  --   HGB 11.8* 10.1*  HCT 34.4* 29.7*  MCV 88.7 88.1  PLT 271 258   Cardiac Enzymes:  Recent Labs  04/05/14 2015 04/06/14 0200 04/06/14 0737  TROPONINI >20.00* 13.18* >20.00*   Fasting Lipid Panel:  Recent Labs  04/06/14 0200  CHOL 239*  HDL 32*  LDLCALC 160*  TRIG 234*  CHOLHDL 7.5   Thyroid Function Tests:  Recent Labs  04/05/14 2015  TSH 3.680    Imaging: Dg Chest Portable 1 View  04/05/2014    CLINICAL DATA:  Chest pain.  Short of breath.  EXAM: PORTABLE CHEST - 1 VIEW  COMPARISON:  04/29/2012  FINDINGS: Previous spinal fusion. Dual lead pacemaker with leads in the region of the right atrium and right ventricle. Heart at the upper limits of normal in size. Venous hypertension with interstitial pulmonary edema. No effusions.  IMPRESSION: Interstitial pulmonary edema.   Electronically Signed   By: Nelson Chimes M.D.   On: 04/05/2014 08:21    Cardiac Studies:  ECG:   V pacing    Telemetry:  V pacing no VT   Echo:  1/15  EF 50-55% mild MR   Medications:   . allopurinol  100 mg Oral Daily  . antiseptic oral rinse  15 mL Mouth Rinse BID  . aspirin EC  81 mg Oral Daily  . cloNIDine  0.3 mg Oral TID  . insulin aspart  0-9 Units Subcutaneous TID WC  . metoprolol  100 mg Oral BID  . pantoprazole  40 mg Oral BID  . sodium chloride  3 mL Intravenous Q12H     . nitroGLYCERIN 50 mcg/min (04/06/14 1050)    Assessment/Plan:  MI:  See multiple notes from PA and Dr Harl Bowie  Transferred from [facility] yesterday with HTN urgency and chest pain.  No decision made regarding urgent cath. No over 36 hours From initial presentation and pain free.  ECG not interpretable due to pacing  Troponin markedly elevated since 7/16  Will add heparin and continue nitroglycerin  Echo to reassess EF and gently hydrate  Discussed cath with patient.  Given pain free and elevated Cr as well as multiple MI's in cath lab with 13 cases still to do will arrange for Monday unless She has new symptoms HTN:  Improved continue current meds and iv nitro over weekend given MI Chol:  Intolerant to simvastatin check fasting lipids   TSH is normal despite exophthalmos on exam   Called cath lab and orders done Monday CM   Jenkins Rouge 04/06/2014, 11:06 AM

## 2014-04-06 NOTE — Progress Notes (Signed)
Inpatient Diabetes Program Recommendations  AACE/ADA: New Consensus Statement on Inpatient Glycemic Control (2013)  Target Ranges:  Prepandial:   less than 140 mg/dL      Peak postprandial:   less than 180 mg/dL (1-2 hours)      Critically ill patients:  140 - 180 mg/dL   Results for BRANDYCE, DIMARIO (MRN [id number]) as of 04/06/2014 12:54  Ref. Range 04/05/2014 11:02 04/05/2014 17:25 04/05/2014 19:36 04/06/2014 08:11 04/06/2014 12:20  Glucose-Capillary Latest Range: 70-99 mg/dL 120 (H) 226 (H) 233 (H) 319 (H) 212 (H)   Diabetes history: DM2 Outpatient Diabetes medications: Humalog 75/25 55 units QAM with breakfast, Humalog 75/25 15 units QPM with supper  Current orders for Inpatient glycemic control: Novolog 0-9 units AC  Inpatient Diabetes Program Recommendations Insulin - Basal: Please consider ordering low dose basal insulin; recommend starting with Lantus 19 units Q24H (based on 97 kg x 0.2 units). Correction (SSI): Please consider changing CBG and Novolog frequency to Q4H if patient remains NPO. If diet is resumed, please consider adding Novolog bedtime correction scale. HgbA1C: Please consider ordering an A1C to evaluate glycemic control over the past 2-3 months.  Thanks, Barnie Alderman, RN, MSN, CCRN Diabetes Coordinator Inpatient Diabetes Program [phone] (Team Pager) [phone] ([facility] office) [phone] [facility] office)

## 2014-04-06 NOTE — Progress Notes (Signed)
ANTICOAGULATION CONSULT NOTE - Initial Consult  Pharmacy Consult for Heparin Indication: chest pain/ACS  Allergies  Allergen Reactions  . Simvastatin   . Verapamil    Patient Measurements: Height: 5\' 4"  (162.6 cm) Weight: 215 lb 6.2 oz (97.7 kg) IBW/kg (Calculated) : 54.7 Heparin Dosing Weight: 77Kg  Vital Signs: Temp: 98.6 F (37 C) (07/17 0838) Temp src: Oral (07/17 0838) BP: 119/49 mmHg (07/17 1100) Pulse Rate: 61 (07/17 1100)  Labs:  Recent Labs  04/05/14 0749  04/05/14 2015 04/06/14 0200 04/06/14 0737  HGB 11.8*  --   --  10.1*  --   HCT 34.4*  --   --  29.7*  --   PLT 271  --   --  258  --   APTT 28  --   --   --   --   LABPROT 12.9  --   --   --   --   INR 0.97  --   --   --   --   CREATININE 2.21*  --   --  2.31*  --   TROPONINI <0.30  < > >20.00* 13.18* >20.00*  < > = values in this interval not displayed. Estimated Creatinine Clearance: 26.5 ml/min (by C-G formula based on Cr of 2.31).  Medical History: Past Medical History  Diagnosis Date  . Sick sinus syndrome 2004    [employer] PPM 10/2004 - Dr. Lovena Le  . Coronary atherosclerosis of native coronary artery     Nonobstructive; 30-40% LAD, Cx, and RCA in 2007; normal EF by echo in 2006; nl stress nuclear in 2008  . Chronic diastolic heart failure   . Type 2 diabetes mellitus   . Essential hypertension, benign   . Hyperlipidemia   . Renal cell carcinoma   . GERD (gastroesophageal reflux disease)   . Allergic rhinitis   . Low back pain     Scoliosis and degenerative disc disease with  . Osteoarthritis   . Gout   . Hx of colonic polyps   . CKD (chronic kidney disease) stage 2, GFR 60-89 ml/min    Assessment: 68yo female admitted with CP at [facility] and then transferred to [facility]. Was on heparin at [facility], stopped here. Now to resume per cardiology with elevated troponins. Planning for cath Monday.  Goal of Therapy:  Heparin level 0.3-0.7 units/ml Monitor platelets by anticoagulation protocol: Yes   Plan:   1. Heparin 4000 units IV bolus x 1 now 2. Start heparin drip at 1000 units/hr 3. Heparin level in 6 hours 4. Daily HL and CBC 5. Follow for s/s bleeding, any changes to cath plans   Yanelis Osika D. Meshelle Holness, PharmD, BCPS Clinical Pharmacist Pager: [phone] 04/06/2014 12:00 PM

## 2014-04-06 NOTE — Progress Notes (Signed)
ANTICOAGULATION CONSULT NOTE - Follow Up Consult  Pharmacy Consult for heparin Indication: MI  Labs:  Recent Labs  04/05/14 0749  04/05/14 2015 04/06/14 0200 04/06/14 0737 04/06/14 1225 04/06/14 2150  HGB 11.8*  --   --  10.1*  --   --   --   HCT 34.4*  --   --  29.7*  --   --   --   PLT 271  --   --  258  --   --   --   APTT 28  --   --   --   --   --   --   LABPROT 12.9  --   --   --   --   --   --   INR 0.97  --   --   --   --   --   --   HEPARINUNFRC  --   --   --   --   --   --  0.33  CREATININE 2.21*  --   --  2.31*  --  2.60*  --   TROPONINI <0.30  < > >20.00* 13.18* >20.00*  --   --   < > = values in this interval not displayed.   Assessment/Plan:  68yo female therapeutic on heparin with initial dosing for MI. Will continue gtt at current rate and confirm stable with am labs.   Wynona Neat, PharmD, BCPS  04/06/2014,11:20 PM

## 2014-04-06 NOTE — Significant Event (Signed)
CRITICAL VALUE ALERT  Critical value received:  Trop >20  Date of notification:  04/05/2014   Time of notification:  2100  Critical value read back: Yes  Nurse who received alert:  Mackey Birchwood RN  MD notified (1st page): Jules Husbands MD   Time of first page:  2100  MD notified (2nd page):  Time of second page:  Responding MD:  Jules Husbands MD  Time MD responded:  2105

## 2014-04-06 NOTE — Progress Notes (Signed)
Pt. complains 8/10 chest pain. SBP >200. Nitro gtt infusing at 216mcg.  MD on call notified orders received.

## 2014-04-07 ENCOUNTER — Inpatient Hospital Stay (HOSPITAL_COMMUNITY): Payer: Medicare PPO

## 2014-04-07 DIAGNOSIS — J811 Chronic pulmonary edema: Secondary | ICD-10-CM | POA: Insufficient documentation

## 2014-04-07 DIAGNOSIS — J9601 Acute respiratory failure with hypoxia: Secondary | ICD-10-CM | POA: Diagnosis present

## 2014-04-07 DIAGNOSIS — J9600 Acute respiratory failure, unspecified whether with hypoxia or hypercapnia: Secondary | ICD-10-CM

## 2014-04-07 LAB — IRON AND TIBC
Iron: 25 ug/dL — ABNORMAL LOW (ref 42–135)
Saturation Ratios: 14 % — ABNORMAL LOW (ref 20–55)
TIBC: 184 ug/dL — AB (ref 250–470)
UIBC: 159 ug/dL (ref 125–400)

## 2014-04-07 LAB — GLUCOSE, CAPILLARY
GLUCOSE-CAPILLARY: 256 mg/dL — AB (ref 70–99)
GLUCOSE-CAPILLARY: 188 mg/dL — AB (ref 70–99)
Glucose-Capillary: 267 mg/dL — ABNORMAL HIGH (ref 70–99)
Glucose-Capillary: 340 mg/dL — ABNORMAL HIGH (ref 70–99)
Glucose-Capillary: 301 mg/dL — ABNORMAL HIGH (ref 70–99)

## 2014-04-07 LAB — CBC
HCT: 26.2 % — ABNORMAL LOW (ref 36.0–46.0)
Hemoglobin: 8.7 g/dL — ABNORMAL LOW (ref 12.0–15.0)
MCH: 29.7 pg (ref 26.0–34.0)
MCHC: 33.2 g/dL (ref 30.0–36.0)
MCV: 89.4 fL (ref 78.0–100.0)
PLATELETS: 234 10*3/uL (ref 150–400)
RBC: 2.93 MIL/uL — ABNORMAL LOW (ref 3.87–5.11)
RDW: 13.7 % (ref 11.5–15.5)
WBC: 9.6 10*3/uL (ref 4.0–10.5)

## 2014-04-07 LAB — URINALYSIS W MICROSCOPIC (NOT AT ARMC)
Bilirubin Urine: NEGATIVE
Glucose, UA: 250 mg/dL — AB
Ketones, ur: NEGATIVE mg/dL
LEUKOCYTES UA: NEGATIVE
Nitrite: NEGATIVE
Protein, ur: >300 mg/dL — AB
SPECIFIC GRAVITY, URINE: 1.024 (ref 1.005–1.030)
UROBILINOGEN UA: 1 mg/dL (ref 0.0–1.0)
pH: 5.5 (ref 5.0–8.0)

## 2014-04-07 LAB — CREATININE, URINE, RANDOM: Creatinine, Urine: 196.23 mg/dL

## 2014-04-07 LAB — FERRITIN: FERRITIN: 320 ng/mL — AB (ref 10–291)

## 2014-04-07 LAB — HEMOGLOBIN AND HEMATOCRIT, BLOOD
HEMATOCRIT: 32 % — AB (ref 36.0–46.0)
Hemoglobin: 10.6 g/dL — ABNORMAL LOW (ref 12.0–15.0)

## 2014-04-07 LAB — HEPARIN LEVEL (UNFRACTIONATED)
HEPARIN UNFRACTIONATED: 0.47 [IU]/mL (ref 0.30–0.70)
Heparin Unfractionated: 0.18 IU/mL — ABNORMAL LOW (ref 0.30–0.70)
Heparin Unfractionated: 0.88 IU/mL — ABNORMAL HIGH (ref 0.30–0.70)

## 2014-04-07 LAB — FOLATE: FOLATE: 10.8 ng/mL

## 2014-04-07 LAB — SODIUM, URINE, RANDOM

## 2014-04-07 LAB — VITAMIN B12: VITAMIN B 12: 478 pg/mL (ref 211–911)

## 2014-04-07 LAB — UREA NITROGEN, URINE: Urea Nitrogen, Ur: 803 mg/dL

## 2014-04-07 MED ORDER — HYDRALAZINE HCL 20 MG/ML IJ SOLN
10.0000 mg | INTRAMUSCULAR | Status: DC | PRN
  Administered 2014-04-07 – 2014-04-10 (×6): 20 mg via INTRAVENOUS
  Filled 2014-04-07 (×2): qty 1
  Filled 2014-04-07: qty 2
  Filled 2014-04-07 (×2): qty 1

## 2014-04-07 MED ORDER — FUROSEMIDE 10 MG/ML IJ SOLN
INTRAMUSCULAR | Status: AC
  Filled 2014-04-07: qty 4

## 2014-04-07 MED ORDER — HEPARIN (PORCINE) IN NACL 100-0.45 UNIT/ML-% IJ SOLN
1300.0000 [IU]/h | INTRAMUSCULAR | Status: DC
  Administered 2014-04-08: 1150 [IU]/h via INTRAVENOUS
  Administered 2014-04-09 (×2): 1250 [IU]/h via INTRAVENOUS
  Filled 2014-04-07 (×3): qty 250

## 2014-04-07 MED ORDER — FUROSEMIDE 8 MG/ML PO SOLN: 80.0000 mg | Freq: Once | ORAL | Status: DC

## 2014-04-07 MED ORDER — FUROSEMIDE 10 MG/ML IJ SOLN
80.0000 mg | Freq: Once | INTRAMUSCULAR | Status: AC
  Administered 2014-04-07: 80 mg via INTRAVENOUS

## 2014-04-07 MED ORDER — ATORVASTATIN CALCIUM 20 MG PO TABS
20.0000 mg | ORAL_TABLET | Freq: Every day | ORAL | Status: DC
  Administered 2014-04-08 – 2014-04-16 (×9): 20 mg via ORAL
  Filled 2014-04-07 (×12): qty 1

## 2014-04-07 MED ORDER — HEPARIN BOLUS VIA INFUSION
2000.0000 [IU] | Freq: Once | INTRAVENOUS | Status: AC
  Administered 2014-04-07: 2000 [IU] via INTRAVENOUS
  Filled 2014-04-07: qty 2000

## 2014-04-07 MED ORDER — FUROSEMIDE 10 MG/ML PO SOLN: 80.0000 mg | Freq: Once | ORAL | Status: DC

## 2014-04-07 NOTE — Progress Notes (Signed)
Patient had been doing well this AM with well controlled bp, breathing comfortably. Was able to come off NG drip early this AM and did well. BP suddenly increased to 210s, with acute increased SOB. Placed on bipap with decreased respiratory distress. Restarted on NG drip, given IV hydralazine and lasix 80mg  IV x1. Seen with critical care, appreciate assistance, we have asked for help with her management.    Zandra Abts MD

## 2014-04-07 NOTE — Progress Notes (Signed)
ANTICOAGULATION CONSULT NOTE - Follow Up Consult  Pharmacy Consult for heparin Indication: MI  Labs:  Recent Labs  04/05/14 0749  04/05/14 2015 04/06/14 0200 04/06/14 0737 04/06/14 1225 04/06/14 2150 04/07/14 0230  HGB 11.8*  --   --  10.1*  --   --   --  8.7*  HCT 34.4*  --   --  29.7*  --   --   --  26.2*  PLT 271  --   --  258  --   --   --  234  APTT 28  --   --   --   --   --   --   --   LABPROT 12.9  --   --   --   --   --   --   --   INR 0.97  --   --   --   --   --   --   --   HEPARINUNFRC  --   --   --   --   --   --  0.33 0.18*  CREATININE 2.21*  --   --  2.31*  --  2.60*  --   --   TROPONINI <0.30  < > >20.00* 13.18* >20.00*  --   --   --   < > = values in this interval not displayed.   Assessment: 68yo female now subtherapeutic on heparin after one level at low end of goal, no issues with gtt per RN.  Goal of Therapy:  Heparin level 0.3-0.7 units/ml   Plan:  Will rebolus with heparin 2000 units and increase gtt by 3 units/kg/hr to 1300 units/hr and check level in [place], PharmD, BCPS  04/07/2014,3:32 AM

## 2014-04-07 NOTE — Progress Notes (Signed)
ANTICOAGULATION CONSULT NOTE - [place] for Heparin Indication: chest pain/ACS  Allergies  Allergen Reactions  . Simvastatin     May have caused nausea or itching  . Verapamil    Patient Measurements: Height: 5\' 4"  (162.6 cm) Weight: 213 lb 13.5 oz (97 kg) IBW/kg (Calculated) : 54.7 Heparin Dosing Weight: 77Kg  Vital Signs: Temp: 98.2 F (36.8 C) (07/18 2012) Temp src: Oral (07/18 2012) BP: 138/63 mmHg (07/18 2000) Pulse Rate: 75 (07/18 2000)  Labs:  Recent Labs  04/05/14 0749  04/05/14 2015 04/06/14 0200 04/06/14 0737 04/06/14 1225  04/07/14 0230 04/07/14 1125 04/07/14 1545 04/07/14 2055  HGB 11.8*  --   --  10.1*  --   --   --  8.7*  --  10.6*  --   HCT 34.4*  --   --  29.7*  --   --   --  26.2*  --  32.0*  --   PLT 271  --   --  258  --   --   --  234  --   --   --   APTT 28  --   --   --   --   --   --   --   --   --   --   LABPROT 12.9  --   --   --   --   --   --   --   --   --   --   INR 0.97  --   --   --   --   --   --   --   --   --   --   HEPARINUNFRC  --   --   --   --   --   --   < > 0.18* 0.88*  --  0.47  CREATININE 2.21*  --   --  2.31*  --  2.60*  --   --   --   --   --   TROPONINI <0.30  < > >20.00* 13.18* >20.00*  --   --   --   --   --   --   < > = values in this interval not displayed. Estimated Creatinine Clearance: 23.4 ml/min (by C-G formula based on Cr of 2.6).  Medical History: Past Medical History  Diagnosis Date  . Sick sinus syndrome 2004    [employer] PPM 10/2004 - Dr. Lovena Le  . Coronary atherosclerosis of native coronary artery     Nonobstructive; 30-40% LAD, Cx, and RCA in 2007; normal EF by echo in 2006; nl stress nuclear in 2008  . Chronic diastolic heart failure   . Type 2 diabetes mellitus   . Essential hypertension, benign   . Hyperlipidemia   . Renal cell carcinoma   . GERD (gastroesophageal reflux disease)   . Allergic rhinitis   . Low back pain     Scoliosis and degenerative disc disease  with  . Osteoarthritis   . Gout   . Hx of colonic polyps   . CKD (chronic kidney disease) stage 2, GFR 60-89 ml/min    Assessment: 68 yo female admitted with CP at [facility] and then transferred to [facility]. Was on heparin at [facility], stopped here. Then resumed per cardiology with elevated troponins, hypertensive emergency. Planning for cath Monday.  Heparin level now therapeutic after rate decrease this AM.  No bleeding or complications noted.    Goal of Therapy:  Heparin level 0.3-0.7 units/ml Monitor platelets by anticoagulation protocol: Yes   Plan:  1. Continue IV heparin at 1150 units/hr. 2. Continue daily HL and CBC 3. Follow for s/s bleeding, any changes to cath plans  Uvaldo Rising, BCPS  Clinical Pharmacist Pager [phone]  04/07/2014 9:41 PM

## 2014-04-07 NOTE — Progress Notes (Signed)
ANTICOAGULATION CONSULT NOTE - [place] for Heparin Indication: chest pain/ACS  Allergies  Allergen Reactions  . Simvastatin     May have caused nausea or itching  . Verapamil    Patient Measurements: Height: 5\' 4"  (162.6 cm) Weight: 213 lb 13.5 oz (97 kg) IBW/kg (Calculated) : 54.7 Heparin Dosing Weight: 77Kg  Vital Signs: Temp: 98.9 F (37.2 C) (07/18 1147) Temp src: Oral (07/18 1147) BP: 157/64 mmHg (07/18 1200) Pulse Rate: 92 (07/18 1200)  Labs:  Recent Labs  04/05/14 0749  04/05/14 2015 04/06/14 0200 04/06/14 0737 04/06/14 1225 04/06/14 2150 04/07/14 0230 04/07/14 1125  HGB 11.8*  --   --  10.1*  --   --   --  8.7*  --   HCT 34.4*  --   --  29.7*  --   --   --  26.2*  --   PLT 271  --   --  258  --   --   --  234  --   APTT 28  --   --   --   --   --   --   --   --   LABPROT 12.9  --   --   --   --   --   --   --   --   INR 0.97  --   --   --   --   --   --   --   --   HEPARINUNFRC  --   --   --   --   --   --  0.33 0.18* 0.88*  CREATININE 2.21*  --   --  2.31*  --  2.60*  --   --   --   TROPONINI <0.30  < > >20.00* 13.18* >20.00*  --   --   --   --   < > = values in this interval not displayed. Estimated Creatinine Clearance: 23.4 ml/min (by C-G formula based on Cr of 2.6).  Medical History: Past Medical History  Diagnosis Date  . Sick sinus syndrome 2004    [employer] PPM 10/2004 - Dr. Lovena Le  . Coronary atherosclerosis of native coronary artery     Nonobstructive; 30-40% LAD, Cx, and RCA in 2007; normal EF by echo in 2006; nl stress nuclear in 2008  . Chronic diastolic heart failure   . Type 2 diabetes mellitus   . Essential hypertension, benign   . Hyperlipidemia   . Renal cell carcinoma   . GERD (gastroesophageal reflux disease)   . Allergic rhinitis   . Low back pain     Scoliosis and degenerative disc disease with  . Osteoarthritis   . Gout   . Hx of colonic polyps   . CKD (chronic kidney disease) stage 2, GFR  60-89 ml/min    Assessment: 68yo female admitted with CP at [facility] and then transferred to [facility]. Was on heparin at [facility], stopped here. Then resumed per cardiology with elevated troponins, hypertensive emergency. Planning for cath Monday.  Heparin level now supratherapeutic after rate increase this AM.  No bleeding or complications noted.  Platelet count stable, but Hgb declining since admission.  No overt bleeding noted. ?Volume related  Goal of Therapy:  Heparin level 0.3-0.7 units/ml Monitor platelets by anticoagulation protocol: Yes   Plan:  1. Decrease IV heparin to 1150 units/hr. 2. Recheck heparin level in 8 hrs. 3. Daily HL and CBC 4. Follow for s/s bleeding, any changes  to cath plans  Uvaldo Rising, BCPS  Clinical Pharmacist Pager [phone]  04/07/2014 1:19 PM

## 2014-04-07 NOTE — Progress Notes (Addendum)
Patient ID: MEAH JIRON, female   DOB: [birth year], 68 y.o.   MRN: [id number]      Subjective:    No chest pain  Objective:   Temp:  [97.4 F (36.3 C)-99.3 F (37.4 C)] 99.3 F (37.4 C) (07/18 0750) Pulse Rate:  [58-85] 63 (07/18 0800) Resp:  [12-31] 19 (07/18 0800) BP: (105-210)/(42-85) 169/63 mmHg (07/18 0800) SpO2:  [96 %-100 %] 100 % (07/18 0800) Weight:  [213 lb 13.5 oz (97 kg)] 213 lb 13.5 oz (97 kg) (07/18 0500) Last BM Date: 04/05/14  Filed Weights   04/05/14 0744 04/06/14 0500 04/07/14 0500  Weight: 217 lb (98.431 kg) 215 lb 6.2 oz (97.7 kg) 213 lb 13.5 oz (97 kg)    Intake/Output Summary (Last 24 hours) at 04/07/14 0840 Last data filed at 04/07/14 0800  Gross per 24 hour  Intake 1612.95 ml  Output    610 ml  Net 1002.95 ml    Telemetry: paced  Exam:  General: NAD  Resp:CTAB  Cardiac: RRR, no m/r/g, no JVD  GI: abdomen soft, NT, ND  MSK: no LE edema  Neuro: no focal deficits  Psych: appropraite affect  Lab Results:  Basic Metabolic Panel:  Recent Labs Lab 04/05/14 0749 04/06/14 0200 04/06/14 1225  NA 144 139 140  K 3.4* 3.7 3.6*  CL 105 100 99  CO2 25 24 27   GLUCOSE 64* 350* 212*  BUN 31* 29* 32*  CREATININE 2.21* 2.31* 2.60*  CALCIUM 9.3 8.5 8.1*    Liver Function Tests:  Recent Labs Lab 04/06/14 0200  AST 79*  ALT 23  ALKPHOS 87  BILITOT 0.3  PROT 6.6  ALBUMIN 2.9*    CBC:  Recent Labs Lab 04/05/14 0749 04/06/14 0200 04/07/14 0230  WBC 6.8 10.2 9.6  HGB 11.8* 10.1* 8.7*  HCT 34.4* 29.7* 26.2*  MCV 88.7 88.1 89.4  PLT 271 258 234    Cardiac Enzymes:  Recent Labs Lab 04/05/14 2015 04/06/14 0200 04/06/14 0737  TROPONINI >20.00* 13.18* >20.00*    BNP:  Recent Labs  04/05/14 0749  PROBNP 2112.0*    Coagulation:  Recent Labs Lab 04/05/14 0749  INR 0.97    ECG:   Medications:   Scheduled Medications: . allopurinol  100 mg Oral Daily  . antiseptic oral rinse  15 mL Mouth Rinse BID  .  aspirin EC  81 mg Oral Daily  . cloNIDine  0.3 mg Oral TID  . insulin aspart  0-9 Units Subcutaneous TID WC  . metoprolol  100 mg Oral BID  . pantoprazole  40 mg Oral BID  . sodium chloride  3 mL Intravenous Q12H     Infusions: . sodium chloride 75 mL/hr at 04/07/14 0800  . heparin 1,300 Units/hr (04/07/14 0800)  . nitroGLYCERIN Stopped (04/07/14 0500)     PRN Medications:  sodium chloride, acetaminophen, hydrALAZINE, morphine injection, nitroGLYCERIN, ondansetron (ZOFRAN) IV, promethazine, sodium chloride   04/06/14 Echo Study Conclusions  - Left ventricle: The cavity size was normal. There was moderate concentric hypertrophy. Systolic function was normal. The estimated ejection fraction was in the range of 50% to 55%. Possible hypokinesis of the apical myocardium. Doppler parameters are consistent with high ventricular filling pressure. - Mitral valve: Moderately calcified annulus. There was mild regurgitation. - Left atrium: The atrium was severely dilated.    Assessment/Plan    1. Hypertensive emergency - severely elevated blood pressures, systolic bp confirmed at 099 on admission. Had evidence of NSTEMI and mild heart failure. Started  on NG with careful gradual decrease in MAPs - trop >20, EKG is V-paced and not interpretable for ischemia.  - good bp control currently, now on oral meds. NG drip weaned off this morning, follow pressures   2. NSTEMI - in setting of hypertensive emergency, trop >20 concerning for possible underlying coronary disease as well - echo LVEF 50-55%, possibple apical hypokinesis. Abnormal diastolic function. - on ASA, beta blocker, hep gtt - statin allergy, reports she thinks simva made her either naseous or itch. Will try low dose atorva to see how she tolerates. Note from Dr Lattie Haw 11/2010 mentions pruritus on pravastatin. Repeat trial of alternative statin given her presentation.   3. Anemia - downtrend in Hgb since admission, she is on  heprain - check fecal occluct stool, send B12, folate, ferritin, serum Fe, TIBC - recheck H&H at 4pm today  - continue heprain for now  3. CKD - hx of nephrectomy, has one kidney and baseline CKD - up trend in Cr since admission - will check UA, FeUrea as she did receive a dose of lasix during admit. May need trial of gentle IVF in consideration of cath.  - given her HTN emergency, she may have actually been total volume down on admission due to pressure natriuresis.       Carlyle Dolly, M.D., F.A.C.C.

## 2014-04-07 NOTE — Consult Note (Signed)
PULMONARY / CRITICAL CARE MEDICINE   Name: Alyssa [name] MRN: [id number] DOB: 04-26-1946    ADMISSION DATE:  04/05/2014 CONSULTATION DATE:  04/07/14  REFERRING MD :  CARD  PRIMARY SERVICE: CARD   CHIEF COMPLAINT:  Resp Distress, O2 sat 70%   BRIEF PATIENT DESCRIPTION:  Alyssa [name] is a 68 y.o.female history of sick sinus syndrome with Medtronic pacemaker,mild non-obstructive CAD by cath 2007, DM2, HTN, hyperlipidemia, renal cell CA with prior left nephrectomy admitted 7/17 with hypertensive emergency/NSTEMI started on Nitro drip .  PCCM called for consult 7/18 for resp distress with increased WOB and Hypoxia w/ apparent flash pulmonary edema     SIGNIFICANT EVENTS / STUDIES:  7/17 2 D echo EF 50-55%, LA severe dilation  7/18-pulmonary edema >BIPAP /PCCM consult   LINES / TUBES:   CULTURES:   ANTIBIOTICS:   HISTORY OF PRESENT ILLNESS:   Alyssa [name] is a 68 y.o.female history of sick sinus syndrome with Medtronic pacemaker,mild non-obstructive CAD by cath 2007, DM2, HTN, hyperlipidemia, renal cell CA with prior left nephrectomy admitted with chest pain on 7/17 by Cardiology .  On arrival to ER b/p >500 systolic . CXR showed mild pulmonary edema , BNP 2112  She was admitted started on Nitro drip . Troponin was >20 .  Plans for cath on 7/20. She is on Hep drip  She was weaned off nitro on 7/18 mid am and given IVF for renal insufficiency in preparation of upcoming cath. Hx of Renal insufficiency w/ renal CA s/p nephrectomy .  Around lunchtime on 7/18 w/ acute resp distress , w/ increased WOB and hypoxic w/ sats ~70%, b/p increased ~938 systolic. She was placed on BIPAP support and restarted on NG drip  Given hydralazine and lasix 80mg  w/ imrpoved b/p control .   Sats improved to 100% on BIPAP .        PAST MEDICAL HISTORY :  Past Medical History  Diagnosis Date  . Sick sinus syndrome 2004    Medtronic PPM 10/2004 - Dr. Lovena Le  . Coronary atherosclerosis of native coronary  artery     Nonobstructive; 30-40% LAD, Cx, and RCA in 2007; normal EF by echo in 2006; nl stress nuclear in 2008  . Chronic diastolic heart failure   . Type 2 diabetes mellitus   . Essential hypertension, benign   . Hyperlipidemia   . Renal cell carcinoma   . GERD (gastroesophageal reflux disease)   . Allergic rhinitis   . Low back pain     Scoliosis and degenerative disc disease with  . Osteoarthritis   . Gout   . Hx of colonic polyps   . CKD (chronic kidney disease) stage 2, GFR 60-89 ml/min    Past Surgical History  Procedure Laterality Date  . Cataract extraction    . Tonsillectomy    . Cholecystectomy      Approx. 1995  . Abdominal hysterectomy      1974  . Nephrectomy      Left nephrectomy- renal cell carcinoma  . Knee surgery    . Insert / replace / remove pacemaker      Medtronic Dual chamber pacemaker 11/11/2004  . Back surgery     Prior to Admission medications   Medication Sig Start Date End Date Taking? Authorizing Provider  allopurinol (ZYLOPRIM) 100 MG tablet Take 100 mg by mouth daily.     Yes Historical Provider, MD  cloNIDine (CATAPRES) 0.3 MG tablet Take 0.3 mg by mouth 3 (three) times daily.  Yes Historical Provider, MD  diltiazem (CARDIZEM) 120 MG tablet 120 mg daily.    Yes Historical Provider, MD  HUMALOG MIX 75/25 (75-25) 100 UNIT/ML SUSP Inject 15-55 Units into the skin 2 (two) times daily with a meal. 55 units in the am and 15 in evening depending on CBG reading 11/12/11  Yes Historical Provider, MD  metoprolol (LOPRESSOR) 100 MG tablet Take 100 mg by mouth 2 (two) times daily.  07/27/11  Yes Yehuda Savannah, MD  Olmesartan-Amlodipine-HCTZ [employer]) 40-5-25 MG TABS Take 1 tablet by mouth daily.     Yes Historical Provider, MD  omeprazole (PRILOSEC) 20 MG capsule Take 20 mg by mouth 2 (two) times daily before a meal.  01/15/12  Yes Historical Provider, MD  tiZANidine (ZANAFLEX) 4 MG tablet Take 4 mg by mouth daily as needed for muscle spasms.   07/21/13  Yes Historical Provider, MD   Allergies  Allergen Reactions  . Simvastatin     May have caused nausea or itching  . Verapamil     FAMILY HISTORY:  Family History  Problem Relation Age of Onset  . Colon polyps Mother 64  . Coronary artery disease Mother   . Breast cancer Other   . Ovarian cancer Other   . Uterine cancer Other   . Heart attack Father    SOCIAL HISTORY:  reports that she has never smoked. She has never used smokeless tobacco. She reports that she does not drink alcohol or use illicit drugs.  REVIEW OF SYSTEMS:   Constitutional:  ++fatigue, or  lassitude.   CV:  + Orthopnea, PND, swelling in lower extremities,    GI  No heartburn, indigestion, abdominal pain, nausea, vomiting, diarrhea, change in bowel habits, loss of appetite, bloody stools.   Resp: ++shortness of breath with exertion or at rest.     Skin: no rash or lesions.  GU: no dysuria, change in color of urine, no urgency or frequency.  No flank pain, no hematuria   MS:  No joint pain or swelling.  No decreased range of motion.  No back pain.  Psych:  No change in mood or affect. No depression or anxiety.  No memory loss.       SUBJECTIVE:  Called for consult for acute resp distress with hypoxia and Increased WOB   VITAL SIGNS: Temp:  [97.4 F (36.3 C)-99.3 F (37.4 C)] 98.9 F (37.2 C) (07/18 1147) Pulse Rate:  [59-120] 95 (07/18 1150) Resp:  [12-33] 27 (07/18 1150) BP: (105-262)/(42-166) 158/67 mmHg (07/18 1150) SpO2:  [62 %-100 %] 100 % (07/18 1150) Weight:  [97 kg (213 lb 13.5 oz)] 97 kg (213 lb 13.5 oz) (07/18 0500) HEMODYNAMICS:   VENTILATOR SETTINGS:   INTAKE / OUTPUT: Intake/Output     07/17 0701 - 07/18 0700 07/18 0701 - 07/19 0700   I.V. (mL/kg) 1543 (15.9) 386.6 (4)   Total Intake(mL/kg) 1543 (15.9) 386.6 (4)   Urine (mL/kg/hr) 495 (0.2) 400 (0.8)   Total Output 495 400   Net +1048 -13.4          PHYSICAL EXAMINATION:  GEN: Alert , sitting up chair,  increased WOB   HEENT:  [place]/AT,     NECK:  Supple w/ fair ROM; no JVD;    RESP  Bibasilar crackles   CARD:  RRR, no m/r/g  , tr  peripheral edema, pulses intact, no cyanosis or clubbing.  GI:   Soft & nt; BS+ ,obese   Musco: Warm bil, no deformities or  joint swelling noted.   Neuro: alert, anxious   Skin: Warm, no lesions or rashes   LABS:  CBC  Recent Labs Lab 04/05/14 0749 04/06/14 0200 04/07/14 0230  WBC 6.8 10.2 9.6  HGB 11.8* 10.1* 8.7*  HCT 34.4* 29.7* 26.2*  PLT 271 258 234   Coag's  Recent Labs Lab 04/05/14 0749  APTT 28  INR 0.97   BMET  Recent Labs Lab 04/05/14 0749 04/06/14 0200 04/06/14 1225  NA 144 139 140  K 3.4* 3.7 3.6*  CL 105 100 99  CO2 25 24 27   BUN 31* 29* 32*  CREATININE 2.21* 2.31* 2.60*  GLUCOSE 64* 350* 212*   Electrolytes  Recent Labs Lab 04/05/14 0749 04/06/14 0200 04/06/14 1225  CALCIUM 9.3 8.5 8.1*   Sepsis Markers No results found for this basename: LATICACIDVEN, PROCALCITON, O2SATVEN,  in the last 168 hours ABG No results found for this basename: PHART, PCO2ART, PO2ART,  in the last 168 hours Liver Enzymes  Recent Labs Lab 04/06/14 0200  AST 79*  ALT 23  ALKPHOS 87  BILITOT 0.3  ALBUMIN 2.9*   Cardiac Enzymes  Recent Labs Lab 04/05/14 0749  04/05/14 2015 04/06/14 0200 04/06/14 0737  TROPONINI <0.30  < > >20.00* 13.18* >20.00*  PROBNP 2112.0*  --   --   --   --   < > = values in this interval not displayed. Glucose  Recent Labs Lab 04/06/14 1220 04/06/14 1605 04/06/14 1942 04/07/14 0012 04/07/14 0718 04/07/14 1143  GLUCAP 212* 106* 141* 267* 256* 340*    Imaging No results found.   CXR: Stable low lung volumes and probable diffuse pulmonary interstitial  edema. Mild worsening of atelectasis or infiltrates in both lung  bases.    ASSESSMENT / PLAN:  PULMONARY A: Acute Respiratory Failure with suspected acute pulmonary edema  P:   Diuresis w/ Lasix 80mg  IV x 1  Place BIPAP  support , evaluate for intubation if not improving  Check cxr now     CARDIOVASCULAR A: Hypertensive Emergency  NSTEMI w/ elevated troponin /  7/17 Echo EF 50-55%, LA severe dilation, apical hypokinesis  P:  Cards following  Restart Nitro drip  Lasix 80mg  IV x 1  D/C IVF Hep Drip      RENAL A: Acute on  Chronic Renal failure (baseline ~2.1 09/2013 )  Hx of renal cancer s/p nephrectomy  P:   Monitor I/O closely    GASTROINTESTINAL A:   P:     HEMATOLOGIC A: Anemia -chronic ? No sign of overt bleeding  P:  Monitor h/h closely    INFECTIOUS A:  No apparent infectious source  P:   Tr temp/wbc   ENDOCRINE A:  DM  P:   SSI   NEUROLOGIC A:  Lethargic but follows commands  P:   Monitor closely   TODAY'S SUMMARY: 68 yo female with CAD , sick sinus s/p pacemaker admitted w/ hypertensive emergency /NSTEMI w/ acute pulm edema improving w/ BIPAP support , diuresis and b/p control .   PARRETT,TAMMY NP-C Pulmonary and [place] Pager: [phone]  I have personally obtained a history, examined the patient, evaluated laboratory and imaging results, formulated the assessment and plan and placed orders. CRITICAL CARE: The patient is critically ill with multiple organ systems failure and requires high complexity decision making for assessment and support, frequent evaluation and titration of therapies, application of advanced monitoring technologies and extensive interpretation of multiple databases. Critical Care Time devoted  to patient care services described in this note is 35 minutes.    Merton Border, MD ; [facility] [phone].  After 5:30 PM or weekends, call [phone]  04/07/2014, 12:16 PM

## 2014-04-08 DIAGNOSIS — J810 Acute pulmonary edema: Secondary | ICD-10-CM

## 2014-04-08 LAB — BASIC METABOLIC PANEL
Anion gap: 19 — ABNORMAL HIGH (ref 5–15)
BUN: 38 mg/dL — ABNORMAL HIGH (ref 6–23)
CALCIUM: 8.1 mg/dL — AB (ref 8.4–10.5)
CO2: 20 mEq/L (ref 19–32)
CREATININE: 3.06 mg/dL — AB (ref 0.50–1.10)
Chloride: 98 mEq/L (ref 96–112)
GFR calc non Af Amer: 15 mL/min — ABNORMAL LOW (ref >90)
GFR, EST AFRICAN AMERICAN: 17 mL/min — AB (ref >90)
Glucose, Bld: 246 mg/dL — ABNORMAL HIGH (ref 70–99)
Potassium: 3.6 mEq/L — ABNORMAL LOW (ref 3.7–5.3)
Sodium: 137 mEq/L (ref 137–147)

## 2014-04-08 LAB — PRO B NATRIURETIC PEPTIDE: Pro B Natriuretic peptide (BNP): 4974 pg/mL — ABNORMAL HIGH (ref 0–125)

## 2014-04-08 LAB — GLUCOSE, CAPILLARY
GLUCOSE-CAPILLARY: 263 mg/dL — AB (ref 70–99)
Glucose-Capillary: 207 mg/dL — ABNORMAL HIGH (ref 70–99)
Glucose-Capillary: 236 mg/dL — ABNORMAL HIGH (ref 70–99)

## 2014-04-08 LAB — CBC
HCT: 27.2 % — ABNORMAL LOW (ref 36.0–46.0)
Hemoglobin: 9 g/dL — ABNORMAL LOW (ref 12.0–15.0)
MCH: 29.9 pg (ref 26.0–34.0)
MCHC: 33.1 g/dL (ref 30.0–36.0)
MCV: 90.4 fL (ref 78.0–100.0)
PLATELETS: 216 10*3/uL (ref 150–400)
RBC: 3.01 MIL/uL — AB (ref 3.87–5.11)
RDW: 13.8 % (ref 11.5–15.5)
WBC: 11.5 10*3/uL — AB (ref 4.0–10.5)

## 2014-04-08 LAB — HEPARIN LEVEL (UNFRACTIONATED): HEPARIN UNFRACTIONATED: 0.41 [IU]/mL (ref 0.30–0.70)

## 2014-04-08 MED ORDER — DEXTROSE 5 % IV SOLN
160.0000 mg | Freq: Three times a day (TID) | INTRAVENOUS | Status: DC
  Administered 2014-04-09: 160 mg via INTRAVENOUS
  Filled 2014-04-08 (×3): qty 16

## 2014-04-08 MED ORDER — CLONIDINE HCL 0.2 MG PO TABS
0.2000 mg | ORAL_TABLET | Freq: Two times a day (BID) | ORAL | Status: DC
  Administered 2014-04-09: 0.2 mg via ORAL
  Filled 2014-04-08 (×2): qty 1

## 2014-04-08 MED ORDER — FERROUS SULFATE 325 (65 FE) MG PO TABS
325.0000 mg | ORAL_TABLET | Freq: Two times a day (BID) | ORAL | Status: DC
  Administered 2014-04-08 – 2014-04-09 (×3): 325 mg via ORAL
  Filled 2014-04-08 (×5): qty 1

## 2014-04-08 MED ORDER — FUROSEMIDE 10 MG/ML IJ SOLN
80.0000 mg | Freq: Once | INTRAMUSCULAR | Status: AC
  Administered 2014-04-08: 80 mg via INTRAVENOUS
  Filled 2014-04-08: qty 8

## 2014-04-08 MED ORDER — FUROSEMIDE 10 MG/ML IJ SOLN
80.0000 mg | Freq: Two times a day (BID) | INTRAMUSCULAR | Status: DC
  Administered 2014-04-08 (×2): 80 mg via INTRAVENOUS
  Filled 2014-04-08 (×2): qty 8

## 2014-04-08 MED ORDER — INSULIN GLARGINE 100 UNIT/ML ~~LOC~~ SOLN
10.0000 [IU] | Freq: Every day | SUBCUTANEOUS | Status: DC
  Administered 2014-04-08: 10 [IU] via SUBCUTANEOUS
  Filled 2014-04-08 (×2): qty 0.1

## 2014-04-08 MED ORDER — METOPROLOL TARTRATE 50 MG PO TABS
50.0000 mg | ORAL_TABLET | Freq: Two times a day (BID) | ORAL | Status: DC
  Administered 2014-04-09: 50 mg via ORAL
  Filled 2014-04-08 (×2): qty 1

## 2014-04-08 NOTE — Progress Notes (Addendum)
Patient ID: JOSELLE DEEDS, female   DOB: 1946/01/01, 68 y.o.   MRN: [id number]     Subjective:    No chest pain. +SOB  Objective:   Temp:  [98.2 F (36.8 C)-99.1 F (37.3 C)] 98.2 F (36.8 C) (07/19 0417) Pulse Rate:  [59-120] 86 (07/19 0744) Resp:  [14-34] 21 (07/19 0744) BP: (93-262)/(37-166) 116/62 mmHg (07/19 0744) SpO2:  [62 %-100 %] 100 % (07/19 0744) FiO2 (%):  [60 %] 60 % (07/19 0745) Weight:  [221 lb 1.9 oz (100.3 kg)] 221 lb 1.9 oz (100.3 kg) (07/19 0500) Last BM Date: 04/05/14  Filed Weights   04/06/14 0500 04/07/14 0500 04/08/14 0500  Weight: 215 lb 6.2 oz (97.7 kg) 213 lb 13.5 oz (97 kg) 221 lb 1.9 oz (100.3 kg)    Intake/Output Summary (Last 24 hours) at 04/08/14 0917 Last data filed at 04/08/14 0600  Gross per 24 hour  Intake 596.01 ml  Output    830 ml  Net -233.99 ml    Telemetry: V-paced  Exam:  General: NAD  Resp: coarse bilateraly  Cardiac: RRR, no m/r/g  GI: abdomen soft, NT, ND  MSK: LEs are warm, no edema  Neuro: no focal deficits   Lab Results:  Basic Metabolic Panel:  Recent Labs Lab 04/05/14 0749 04/06/14 0200 04/06/14 1225  NA 144 139 140  K 3.4* 3.7 3.6*  CL 105 100 99  CO2 25 24 27   GLUCOSE 64* 350* 212*  BUN 31* 29* 32*  CREATININE 2.21* 2.31* 2.60*  CALCIUM 9.3 8.5 8.1*    Liver Function Tests:  Recent Labs Lab 04/06/14 0200  AST 79*  ALT 23  ALKPHOS 87  BILITOT 0.3  PROT 6.6  ALBUMIN 2.9*    CBC:  Recent Labs Lab 04/06/14 0200 04/07/14 0230 04/07/14 1545 04/08/14 0215  WBC 10.2 9.6  --  11.5*  HGB 10.1* 8.7* 10.6* 9.0*  HCT 29.7* 26.2* 32.0* 27.2*  MCV 88.1 89.4  --  90.4  PLT 258 234  --  216    Cardiac Enzymes:  Recent Labs Lab 04/05/14 2015 04/06/14 0200 04/06/14 0737  TROPONINI >20.00* 13.18* >20.00*    BNP:  Recent Labs  04/05/14 0749  PROBNP 2112.0*    Coagulation:  Recent Labs Lab 04/05/14 0749  INR 0.97    ECG:   Medications:   Scheduled  Medications: . allopurinol  100 mg Oral Daily  . antiseptic oral rinse  15 mL Mouth Rinse BID  . aspirin EC  81 mg Oral Daily  . atorvastatin  20 mg Oral q1800  . cloNIDine  0.3 mg Oral TID  . insulin aspart  0-9 Units Subcutaneous TID WC  . metoprolol  100 mg Oral BID  . pantoprazole  40 mg Oral BID  . sodium chloride  3 mL Intravenous Q12H     Infusions: . heparin 1,150 Units/hr (04/08/14 0631)  . nitroGLYCERIN 50 mcg/min (04/08/14 0700)     PRN Medications:  sodium chloride, acetaminophen, hydrALAZINE, morphine injection, nitroGLYCERIN, ondansetron (ZOFRAN) IV, promethazine, sodium chloride     Assessment/Plan   1. Hypertensive emergency  - severely elevated blood pressures on admission, systolic bp confirmed at 623 on admission. Had evidence of NSTEMI and mild heart failure. Started on NG with careful gradual decrease in MAPs  - trop >20, EKG is V-paced and not interpretable for ischemia.  - yesterday AM was able to wean off NG gtt, had fairly rapid increase in bp with recurrence of SOB, placed on  BIPAP. Placed back on NG gtt. CXR at that time showed pulm edema.   - somewhat labile pressures overnight. Initial MAP on admit 117, goal over first 24 hrs was MAP of around 90. She is more than 24 hrs out, more aggressive MAP goal is reasonable.  - currently on clonidine 0.3 mg tid, hydral prn, metoprolol 100mg  bid, NG drip. Avoiding ACE or ARB due to renal function  2. NSTEMI  - in setting of hypertensive emergency, trop >20 concerning for possible underlying coronary disease as well  - echo LVEF 50-55%, possible apical hypokinesis. Abnormal diastolic function.  - on ASA, beta blocker, hep gtt  - statin allergy, reports she thinks simva made her either naseous or itch. Will try low dose atorva to see how she tolerates. Note from Dr Lattie Haw 11/2010 mentions pruritus on pravastatin. Repeat trial of alternative statin given her presentation.  - defer cath at this time due to  significant renal dysfunction, continue medical management of HTN emergency and NSTEMI.   3. Anemia  - Hgb up and down since admission, fairly odd trend. Overall seems to be stable around 9-10 - high ferritin, normal folate and B12 - continue to follow trend for now - continue heprain for now   4. CKD  - hx of nephrectomy, has one kidney and baseline CKD  - up trend in Cr since admission, labs pending today. UA with proteinuria. FeUrea 33%, though she has mainly had issues with pulm edema, does not clearly appear she is dry.  - follow up labs today, pending results will likely will need renal consult  4. Pulmonary edema - underlying diastolic dysfunction combined with high blood pressures. Still requiring bipap.  - net negative 233 mL yesterday, she received one dose of IV lasix 80mg . Total net negative 650 mL.  - will redose lasix today IV 80mg  bid. Follow up labs and renal function.    Appreciate the assistance in this complicated patient from critical care service.    Carlyle Dolly, M.D., F.A.C.C.

## 2014-04-08 NOTE — Progress Notes (Signed)
Foley irrigated . Unable to flush. Foley dcd. Tip clogged with sediments.new foley placed. Tolerated well. Immediately drained 500 cc concentrated urine.

## 2014-04-08 NOTE — Progress Notes (Signed)
ANTICOAGULATION CONSULT NOTE - [place] for Heparin Indication: chest pain/ACS  Allergies  Allergen Reactions  . Simvastatin     May have caused nausea or itching  . Verapamil    Patient Measurements: Height: 5\' 4"  (162.6 cm) Weight: 221 lb 1.9 oz (100.3 kg) IBW/kg (Calculated) : 54.7 Heparin Dosing Weight: 77Kg  Vital Signs: Temp: 98.2 F (36.8 C) (07/19 0417) Temp src: Oral (07/19 0417) BP: 116/62 mmHg (07/19 0744) Pulse Rate: 86 (07/19 0744)  Labs:  Recent Labs  04/05/14 2015  04/06/14 0200 04/06/14 0737 04/06/14 1225  04/07/14 0230 04/07/14 1125 04/07/14 1545 04/07/14 2055 04/08/14 0215  HGB  --   < > 10.1*  --   --   --  8.7*  --  10.6*  --  9.0*  HCT  --   < > 29.7*  --   --   --  26.2*  --  32.0*  --  27.2*  PLT  --   --  258  --   --   --  234  --   --   --  216  HEPARINUNFRC  --   --   --   --   --   < > 0.18* 0.88*  --  0.47 0.41  CREATININE  --   --  2.31*  --  2.60*  --   --   --   --   --   --   TROPONINI >20.00*  --  13.18* >20.00*  --   --   --   --   --   --   --   < > = values in this interval not displayed. Estimated Creatinine Clearance: 23.8 ml/min (by C-G formula based on Cr of 2.6).  Medical History: Past Medical History  Diagnosis Date  . Sick sinus syndrome 2004    Medtronic PPM 10/2004 - Dr. Lovena Le  . Coronary atherosclerosis of native coronary artery     Nonobstructive; 30-40% LAD, Cx, and RCA in 2007; normal EF by echo in 2006; nl stress nuclear in 2008  . Chronic diastolic heart failure   . Type 2 diabetes mellitus   . Essential hypertension, benign   . Hyperlipidemia   . Renal cell carcinoma   . GERD (gastroesophageal reflux disease)   . Allergic rhinitis   . Low back pain     Scoliosis and degenerative disc disease with  . Osteoarthritis   . Gout   . Hx of colonic polyps   . CKD (chronic kidney disease) stage 2, GFR 60-89 ml/min    Assessment: 68 yo female admitted with CP at [facility] and then  transferred to [facility]. Was on heparin at [facility], stopped here. Then resumed per cardiology with elevated troponins, hypertensive emergency. May need cath eventually, but deferred due to renal function.  Heparin level now therapeutic after rate adjusted yesterday.  No bleeding or complications noted.    Goal of Therapy:  Heparin level 0.3-0.7 units/ml Monitor platelets by anticoagulation protocol: Yes   Plan:  1. Continue IV heparin at 1150 units/hr. 2. Continue daily HL and CBC 3. Follow for s/s bleeding, plans for cath lab.  Uvaldo Rising, BCPS  Clinical Pharmacist Pager [phone]  04/08/2014 11:17 AM

## 2014-04-08 NOTE — Progress Notes (Signed)
PULMONARY / CRITICAL CARE MEDICINE   Name: Alyssa [name] MRN: [id number] DOB: 02-21-1946    ADMISSION DATE:  04/05/2014 CONSULTATION DATE:  04/07/14  REFERRING MD :  CARD  PRIMARY SERVICE: CARD   CHIEF COMPLAINT:  Resp Distress, O2 sat 70%   BRIEF PATIENT DESCRIPTION:  Alyssa [name] is a 68 y.o.female history of sick sinus syndrome with Medtronic pacemaker,mild non-obstructive CAD by cath 2007, DM2, HTN, hyperlipidemia, renal cell CA with prior left nephrectomy admitted 7/17 with hypertensive emergency/NSTEMI started on Nitro drip .  PCCM called for consult 7/18 for resp distress with increased WOB and Hypoxia w/ apparent flash pulmonary edema     SIGNIFICANT EVENTS / STUDIES:  7/17 2 D echo EF 50-55%, LA severe dilation  7/18-pulmonary edema >BIPAP /PCCM consult   LINES / TUBES:   CULTURES:   ANTIBIOTICS:   HISTORY OF PRESENT ILLNESS:   Alyssa [name] is a 68 y.o.female history of sick sinus syndrome with Medtronic pacemaker,mild non-obstructive CAD by cath 2007, DM2, HTN, hyperlipidemia, renal cell CA with prior left nephrectomy admitted with chest pain on 7/17 by Cardiology .  On arrival to ER b/p >482 systolic . CXR showed mild pulmonary edema , BNP 2112  She was admitted started on Nitro drip . Troponin was >20 .  Plans for cath on 7/20. She is on Hep drip  She was weaned off nitro on 7/18 mid am and given IVF for renal insufficiency in preparation of upcoming cath. Hx of Renal insufficiency w/ renal CA s/p nephrectomy .  Around lunchtime on 7/18 w/ acute resp distress , w/ increased WOB and hypoxic w/ sats ~70%, b/p increased ~500 systolic. She was placed on BIPAP support and restarted on NG drip  Given hydralazine and lasix 80mg  w/ imrpoved b/p control .   Sats improved to 100% on BIPAP .   SUBJECTIVE:   Improved on BIPAP yesterday, removed overnight however early am w/ episode of resp distress , hypoxia and severe HTN placed back on BIPAP   Improved , sitting up in bed ,  decreased dyspnea ,  Good UOP w/ lasix   VITAL SIGNS: Temp:  [98.2 F (36.8 C)-99.1 F (37.3 C)] 98.2 F (36.8 C) (07/19 0417) Pulse Rate:  [59-120] 86 (07/19 0744) Resp:  [14-34] 21 (07/19 0744) BP: (93-262)/(37-166) 116/62 mmHg (07/19 0744) SpO2:  [62 %-100 %] 100 % (07/19 0744) FiO2 (%):  [60 %] 60 % (07/19 0745) Weight:  [100.3 kg (221 lb 1.9 oz)] 100.3 kg (221 lb 1.9 oz) (07/19 0500) HEMODYNAMICS:   VENTILATOR SETTINGS: Vent Mode:  [-] BIPAP FiO2 (%):  [60 %] 60 % Set Rate:  [15 bmp] 15 bmp PEEP:  [8 cmH20] 8 cmH20 INTAKE / OUTPUT: Intake/Output     07/18 0701 - 07/19 0700 07/19 0701 - 07/20 0700   I.V. (mL/kg) 772 (7.7)    Total Intake(mL/kg) 772 (7.7)    Urine (mL/kg/hr) 1005 (0.4)    Total Output 1005     Net -233            PHYSICAL EXAMINATION:  GEN: Alert , sitting up in bed, no distress on BIPAP   HEENT:  [place]/AT,     NECK:  Supple w/ fair ROM; no JVD;    RESP  Diminished BS in bases   CARD:  RRR, no m/r/g  , tr  peripheral edema, pulses intact, no cyanosis or clubbing.  GI:   Soft & nt; BS+ ,obese   Musco: Warm bil, no deformities  or joint swelling noted.   Neuro: alert, anxious   Skin: Warm, no lesions or rashes   LABS:  CBC  Recent Labs Lab 04/06/14 0200 04/07/14 0230 04/07/14 1545 04/08/14 0215  WBC 10.2 9.6  --  11.5*  HGB 10.1* 8.7* 10.6* 9.0*  HCT 29.7* 26.2* 32.0* 27.2*  PLT 258 234  --  216   Coag's  Recent Labs Lab 04/05/14 0749  APTT 28  INR 0.97   BMET  Recent Labs Lab 04/05/14 0749 04/06/14 0200 04/06/14 1225  NA 144 139 140  K 3.4* 3.7 3.6*  CL 105 100 99  CO2 25 24 27   BUN 31* 29* 32*  CREATININE 2.21* 2.31* 2.60*  GLUCOSE 64* 350* 212*   Electrolytes  Recent Labs Lab 04/05/14 0749 04/06/14 0200 04/06/14 1225  CALCIUM 9.3 8.5 8.1*   Sepsis Markers No results found for this basename: LATICACIDVEN, PROCALCITON, O2SATVEN,  in the last 168 hours ABG No results found for this basename: PHART,  PCO2ART, PO2ART,  in the last 168 hours Liver Enzymes  Recent Labs Lab 04/06/14 0200  AST 79*  ALT 23  ALKPHOS 87  BILITOT 0.3  ALBUMIN 2.9*   Cardiac Enzymes  Recent Labs Lab 04/05/14 0749  04/05/14 2015 04/06/14 0200 04/06/14 0737  TROPONINI <0.30  < > >20.00* 13.18* >20.00*  PROBNP 2112.0*  --   --   --   --   < > = values in this interval not displayed. Glucose  Recent Labs Lab 04/07/14 0012 04/07/14 0718 04/07/14 1143 04/07/14 1615 04/07/14 2217 04/08/14 0710  GLUCAP 267* 256* 340* 301* 188* 263*    Imaging Dg Chest Port 1v Same Day  04/07/2014   CLINICAL DATA:  Shortness of breath.  Hypoxia.  EXAM: PORTABLE CHEST - 1 VIEW SAME DAY  COMPARISON:  04/05/2014  FINDINGS: Low lung volumes are again demonstrated. Heart size remains stable. Mild interstitial edema pattern again noted. There is mild worsening of atelectasis or infiltrate in the lung bases bilaterally. No definite pleural effusion seen. Dual lead transvenous pacemaker posterior spinal fixation rods remain in place.  IMPRESSION: Stable low lung volumes and probable diffuse pulmonary interstitial edema. Mild worsening of atelectasis or infiltrates in both lung bases.   Electronically Signed   By: Earle Gell M.D.   On: 04/07/2014 12:58     CXR: 7/18 Stable low lung volumes and probable diffuse pulmonary interstitial edema. Mild worsening of atelectasis or infiltrates in both lung bases.  7/19 Pending >   ASSESSMENT / PLAN:  PULMONARY A: Acute Respiratory Failure with suspected acute pulmonary edema -improving on BIPAP /diuresis  P:   Diuresis w/ Lasix 80mg  IV x 1  Use  BIPAP support As needed   Check cxr in am     CARDIOVASCULAR A: Hypertensive Emergency  NSTEMI w/ elevated troponin /  7/17 Echo EF 50-55%, LA severe dilation, apical hypokinesis  P:  Cards following  Continue on Nitro drip  Cont Hep Drip.  Goal neg I/O bal   repeat BNP Cont clonidine/metoprolol   RENAL A: Acute on   Chronic Renal failure (baseline ~2.1 09/2013)  Hx of renal cancer s/p nephrectomy  P:   Monitor I/O closely  Check bmet today    GASTROINTESTINAL A:  GERD  P:   Cont PPI   HEMATOLOGIC A: Anemia -chronic ? No sign of overt bleeding  Iron deficient , B12 nml  P:  Monitor h/h closely  Add Ferrous Sulfate    INFECTIOUS A:  No apparent infectious source  P:   Tr temp/wbc   ENDOCRINE A:  DM -BS tr up  P:   SSI  Add Lantus 10U SQ   NEUROLOGIC A:  Mentation improved on BIPAP  P:   Monitor closely   TODAY'S SUMMARY: 68 yo female with CAD , sick sinus s/p pacemaker admitted w/ hypertensive emergency /NSTEMI w/ acute pulm edema improving w/ BIPAP support , diuresis and b/p control .   PARRETT,TAMMY NP-C Pulmonary and [place] Pager: [phone]   PCCM ATTENDING: I have interviewed and examined the patient and reviewed the database. I have formulated the assessment and plan as reflected in the note above with amendments made by me.   Merton Border, MD;  PCCM service; Mobile [phone]  04/08/2014, 8:48 AM

## 2014-04-08 NOTE — Consult Note (Signed)
Renal Service Consult Note [facility]  Alyssa [name] 04/08/2014 Roney Jaffe D Requesting Physician:  Dr Harl Bowie  Reason for Consult:  Acute on CKD HPI: The patient is a 68 y.o. year-old w hx of HTN, DM, CKD, left nephrectomy 2001 for RCC and PPM. She presented on 7/16 to ED with L arm pain , SOB and gen weakness. Old heart cath in 2007 showed mild non-obstructive disease.  BP was very high in the ED, SBP 210, and CXR showed pulm edema. She was admitted and treated w IV lasix, O2, IV NTG. Was on bipap overnight and today has come off. Diuresis was marginal with 1005 cc out yesterday and 783 cc in.  Today is 445 in adn 825 out.  Patient is feeling better. No cough or CP now, no prod cough, no dysuria.    She sees Dr Hinda Lenis in [place] for her kidneys.    Date  Creat  eGFR  CKD 2010-2011 1.18- 1.22  2012  1.42 Jan 2015 2.11  27  Stage IV April 05, 2014 2.21  25 April 08, 2014 3.06  17  Chart review: 7/02 - unstable angina, uncont HTN, IDDM, morbid obesity, mild CKD, hx left nephrectomy 2001 for RCC; widowed w 2 children, living in [place]. Stress test neg for ischemia, has cath in 1999 neg for sig CAD. Pt dc'd home 7/03 - L knee meniscectomy 9/04 - chest pain w EKG changes > heart cath showed mild nonobstructive CAD. Treated medically.  Also HTN on six BP meds.  2/06 -  Symptomatic bradycardia requiring PPM placement. Also CKD creat 1.4, HTN on multiple meds, IDDM 9/10 - Colonscopy  ROS  no cough, cp  no jt pain  no abd pain, n/v/d  no rash  no confusion  Past Medical History  Past Medical History  Diagnosis Date  . Sick sinus syndrome 2004    Medtronic PPM 10/2004 - Dr. Lovena Le  . Coronary atherosclerosis of native coronary artery     Nonobstructive; 30-40% LAD, Cx, and RCA in 2007; normal EF by echo in 2006; nl stress nuclear in 2008  . Chronic diastolic heart failure   . Type 2 diabetes mellitus   . Essential hypertension, benign   . Hyperlipidemia    . Renal cell carcinoma   . GERD (gastroesophageal reflux disease)   . Allergic rhinitis   . Low back pain     Scoliosis and degenerative disc disease with  . Osteoarthritis   . Gout   . Hx of colonic polyps   . CKD (chronic kidney disease) stage 2, GFR 60-89 ml/min    Past Surgical History  Past Surgical History  Procedure Laterality Date  . Cataract extraction    . Tonsillectomy    . Cholecystectomy      Approx. 1995  . Abdominal hysterectomy      1974  . Nephrectomy      Left nephrectomy- renal cell carcinoma  . Knee surgery    . Insert / replace / remove pacemaker      Medtronic Dual chamber pacemaker 11/11/2004  . Back surgery     Family History  Family History  Problem Relation Age of Onset  . Colon polyps Mother 27  . Coronary artery disease Mother   . Breast cancer Other   . Ovarian cancer Other   . Uterine cancer Other   . Heart attack Father    Social History  reports that she has never smoked. She has never used smokeless tobacco.  She reports that she does not drink alcohol or use illicit drugs. Allergies  Allergies  Allergen Reactions  . Simvastatin     May have caused nausea or itching  . Verapamil    Home medications Prior to Admission medications   Medication Sig Start Date End Date Taking? Authorizing Provider  allopurinol (ZYLOPRIM) 100 MG tablet Take 100 mg by mouth daily.     Yes Historical Provider, MD  cloNIDine (CATAPRES) 0.3 MG tablet Take 0.3 mg by mouth 3 (three) times daily.     Yes Historical Provider, MD  diltiazem (CARDIZEM) 120 MG tablet 120 mg daily.    Yes Historical Provider, MD  HUMALOG MIX 75/25 (75-25) 100 UNIT/ML SUSP Inject 15-55 Units into the skin 2 (two) times daily with a meal. 55 units in the am and 15 in evening depending on CBG reading 11/12/11  Yes Historical Provider, MD  metoprolol (LOPRESSOR) 100 MG tablet Take 100 mg by mouth 2 (two) times daily.  07/27/11  Yes Yehuda Savannah, MD  Olmesartan-Amlodipine-HCTZ  [facility]) 40-5-25 MG TABS Take 1 tablet by mouth daily.     Yes Historical Provider, MD  omeprazole (PRILOSEC) 20 MG capsule Take 20 mg by mouth 2 (two) times daily before a meal.  01/15/12  Yes Historical Provider, MD  tiZANidine (ZANAFLEX) 4 MG tablet Take 4 mg by mouth daily as needed for muscle spasms.  07/21/13  Yes Historical Provider, MD   Liver Function Tests  Recent Labs Lab 04/06/14 0200  AST 79*  ALT 23  ALKPHOS 87  BILITOT 0.3  PROT 6.6  ALBUMIN 2.9*   No results found for this basename: LIPASE, AMYLASE,  in the last 168 hours CBC  Recent Labs Lab 04/05/14 0749 04/06/14 0200 04/07/14 0230 04/07/14 1545 04/08/14 0215  WBC 6.8 10.2 9.6  --  11.5*  NEUTROABS 3.6  --   --   --   --   HGB 11.8* 10.1* 8.7* 10.6* 9.0*  HCT 34.4* 29.7* 26.2* 32.0* 27.2*  MCV 88.7 88.1 89.4  --  90.4  PLT 271 258 234  --  300   Basic Metabolic Panel  Recent Labs Lab 04/05/14 0749 04/06/14 0200 04/06/14 1225 04/08/14 0950  NA 144 139 140 137  K 3.4* 3.7 3.6* 3.6*  CL 105 100 99 98  CO2 _0 GLUCOSE 64* 350* 212* 246*  BUN 31* 29* 32* 38*  CREATININE 2.21* 2.31* 2.60* 3.06*  CALCIUM 9.3 8.5 8.1* 8.1*    Filed Vitals:   04/08/14 1500 04/08/14 1600 04/08/14 1700 04/08/14 1800  BP: 127/54 133/54  139/54  Pulse: 68 70 64 61  Temp:      TempSrc:      Resp: _1 Height:      Weight:      SpO2: 99% 98% 98% 98%   Exam Alert, obese pleasant AAF in no distress No rash, cyanosis Sclera anicteric, throat clear +JVD no bruits Chest rales 1/3 up on R, L base mostly clear RRR no MRG Abd obese, soft, NTND, no ascites or edema No sig LE or UE edema No ulcer or gangrene Neuro is nf, Ox 3, no asterixis  UA 7/18 1.024, >300 prot, 0-2 wbc, 21-50 rbc, few bact UNa < 20, UCr 196 CXR 7/18 diffuse mild-mod pulm edema REnal US from Apr 2015 > R kidney 11.9 cm, diffuse cortical thinning, ^echogenicity, no hydro; L kidney is surgically absent  Assessment: 1 Acute  on CKD- prob  due to relative hypotension in this pt with prolonged hx of significant HTN 2 CKD stage VI- baseline creat 1.9- 2.1, due to DM/HTN, f/b Dr Hinda Lenis in [place] 3 Hx L nephrectomy '01 for RCC 4 Pulm edema- still wet most likely 5 Obesity  Plan- cut back on BP meds and IV NTG, keep SBP over 130, increased IV lasix to 160 every 8 hours for persistent pulm edema. Will follow.   Kelly Splinter MD (pgr) [phone]    ([phone] 04/08/2014, 6:59 PM

## 2014-04-09 ENCOUNTER — Inpatient Hospital Stay (HOSPITAL_COMMUNITY): Payer: Medicare PPO

## 2014-04-09 ENCOUNTER — Encounter (HOSPITAL_COMMUNITY)
Admission: EM | Disposition: A | Discharge status: Home Health | Payer: Self-pay | Source: Home / Self Care | Attending: Cardiology

## 2014-04-09 DIAGNOSIS — I5031 Acute diastolic (congestive) heart failure: Secondary | ICD-10-CM

## 2014-04-09 DIAGNOSIS — I10 Essential (primary) hypertension: Secondary | ICD-10-CM | POA: Diagnosis present

## 2014-04-09 DIAGNOSIS — Z950 Presence of cardiac pacemaker: Secondary | ICD-10-CM

## 2014-04-09 LAB — GLUCOSE, CAPILLARY
GLUCOSE-CAPILLARY: 198 mg/dL — AB (ref 70–99)
Glucose-Capillary: 269 mg/dL — ABNORMAL HIGH (ref 70–99)
Glucose-Capillary: 288 mg/dL — ABNORMAL HIGH (ref 70–99)
Glucose-Capillary: 288 mg/dL — ABNORMAL HIGH (ref 70–99)
Glucose-Capillary: 247 mg/dL — ABNORMAL HIGH (ref 70–99)

## 2014-04-09 LAB — BASIC METABOLIC PANEL
Anion gap: 15 (ref 5–15)
BUN: 43 mg/dL — AB (ref 6–23)
CHLORIDE: 100 meq/L (ref 96–112)
CO2: 23 mEq/L (ref 19–32)
CREATININE: 3.32 mg/dL — AB (ref 0.50–1.10)
Calcium: 7.8 mg/dL — ABNORMAL LOW (ref 8.4–10.5)
GFR calc non Af Amer: 13 mL/min — ABNORMAL LOW (ref >90)
GFR, EST AFRICAN AMERICAN: 15 mL/min — AB (ref >90)
Glucose, Bld: 249 mg/dL — ABNORMAL HIGH (ref 70–99)
Potassium: 3.5 mEq/L — ABNORMAL LOW (ref 3.7–5.3)
Sodium: 138 mEq/L (ref 137–147)

## 2014-04-09 LAB — CBC
HCT: 24.5 % — ABNORMAL LOW (ref 36.0–46.0)
Hemoglobin: 8 g/dL — ABNORMAL LOW (ref 12.0–15.0)
MCH: 29.5 pg (ref 26.0–34.0)
MCHC: 32.7 g/dL (ref 30.0–36.0)
MCV: 90.4 fL (ref 78.0–100.0)
PLATELETS: 207 10*3/uL (ref 150–400)
RBC: 2.71 MIL/uL — AB (ref 3.87–5.11)
RDW: 13.9 % (ref 11.5–15.5)
WBC: 8.7 10*3/uL (ref 4.0–10.5)

## 2014-04-09 LAB — HEPARIN LEVEL (UNFRACTIONATED)
Heparin Unfractionated: 0.26 IU/mL — ABNORMAL LOW (ref 0.30–0.70)
Heparin Unfractionated: 0.3 IU/mL (ref 0.30–0.70)
Heparin Unfractionated: 0.28 IU/mL — ABNORMAL LOW (ref 0.30–0.70)

## 2014-04-09 SURGERY — LEFT HEART CATHETERIZATION WITH CORONARY ANGIOGRAM
Anesthesia: LOCAL

## 2014-04-09 MED ORDER — INSULIN GLARGINE 100 UNIT/ML ~~LOC~~ SOLN
20.0000 [IU] | Freq: Every day | SUBCUTANEOUS | Status: DC
  Administered 2014-04-09: 20 [IU] via SUBCUTANEOUS
  Filled 2014-04-09 (×3): qty 0.2

## 2014-04-09 MED ORDER — CLONIDINE HCL 0.3 MG PO TABS
0.3000 mg | ORAL_TABLET | Freq: Three times a day (TID) | ORAL | Status: DC
  Administered 2014-04-09 – 2014-04-17 (×24): 0.3 mg via ORAL
  Filled 2014-04-09 (×27): qty 1

## 2014-04-09 MED ORDER — HYDRALAZINE HCL 25 MG PO TABS
25.0000 mg | ORAL_TABLET | Freq: Three times a day (TID) | ORAL | Status: DC
  Administered 2014-04-09 – 2014-04-10 (×3): 25 mg via ORAL
  Filled 2014-04-09 (×6): qty 1

## 2014-04-09 MED ORDER — CARVEDILOL 25 MG PO TABS
25.0000 mg | ORAL_TABLET | Freq: Two times a day (BID) | ORAL | Status: DC
  Administered 2014-04-09 – 2014-04-17 (×16): 25 mg via ORAL
  Filled 2014-04-09 (×20): qty 1

## 2014-04-09 MED ORDER — HEPARIN (PORCINE) IN NACL 100-0.45 UNIT/ML-% IJ SOLN
1400.0000 [IU]/h | INTRAMUSCULAR | Status: DC
  Filled 2014-04-09 (×2): qty 250

## 2014-04-09 NOTE — Progress Notes (Signed)
[facility] Progress Note    Assessment/ Plan:    1 Acute on CKD- prob due to relative hypotension in this pt with prolonged hx of significant HTN, but there are continued episodes of uncontrolled hypertension requiring uptitration temporarily with the nitroglycerin.  -- agree that we are more than 24 hours out but the intermittent episodes of worsening hypertension is concerning.  -- will also check for metanephrines, catecholamines and VMA. -- rbc's in the urine likely from the malignant hypertension which can cause MAHA as well as proteinuria + dysmorphic RBC's in the urine. -- may start amlodipine 5mg  daily as well if BP is not better controlled overnight.  2 CKD stage IV- baseline creat 1.9- 2.1, due to DM/HTN, f/b Dr Hinda Lenis in [place]  3 Hx L nephrectomy '01 for RCC  4 Pulm edema- still wet most likely  5 Obesity    Subjective:   Complains of dyspnea during the episodes of worsening hypertension. She denies chest pain or diaphoresis. No rashes, photophobia or sinus problems.   Objective:   BP 173/62  Pulse 60  Temp(Src) 98 F (36.7 C) (Oral)  Resp 15  Ht 5\' 4"  (1.626 m)  Wt 98.9 kg (218 lb 0.6 oz)  BMI 37.41 kg/m2  SpO2 100%  Intake/Output Summary (Last 24 hours) at 04/09/14 1641 Last data filed at 04/09/14 1100  Gross per 24 hour  Intake 341.21 ml  Output   1100 ml  Net -758.79 ml   Weight change: -1.4 kg (-3 lb 1.4 oz)  Physical Exam: Alert, obese pleasant AAF in no distress  No rash, cyanosis  Sclera anicteric, throat clear  No thyromegaly, no bruits  Chest rales 1/3 up on R, L base mostly clear  RRR no MRG  Abd obese, soft, NTND, no ascites or edema  Trace lower edema  No ulcer or gangrene  Neuro is nf, Ox 3, no asterixis   Imaging: Dg Chest Port 1 View  04/09/2014   CLINICAL DATA:  Pulmonary edema  EXAM: PORTABLE CHEST - 1 VIEW  COMPARISON:  Portable chest x-ray of April 07, 2014  FINDINGS: The lung volumes remain low. The  interstitial markings remain mildly increased but are less prominent. The cardiac silhouette remains enlarged. Its margins are better defined. The central pulmonary vascularity remains engorged but is also improved. The permanent pacemaker is unchanged in position. The bony thorax exhibits no acute change.  IMPRESSION: There has been mild interval decrease in the bilateral pulmonary interstitial edema consistent with improving CHF.   Electronically Signed   By: David  Martinique   On: 04/09/2014 07:54    Labs: BMET  Recent Labs Lab 04/05/14 0749 04/06/14 0200 04/06/14 1225 04/08/14 0950 04/09/14 0227  NA 144 139 140 137 138  K 3.4* 3.7 3.6* 3.6* 3.5*  CL 105 100 99 98 100  CO2 25 24 27 20 23   GLUCOSE 64* 350* 212* 246* 249*  BUN 31* 29* 32* 38* 43*  CREATININE 2.21* 2.31* 2.60* 3.06* 3.32*  CALCIUM 9.3 8.5 8.1* 8.1* 7.8*   CBC  Recent Labs Lab 04/05/14 0749 04/06/14 0200 04/07/14 0230 04/07/14 1545 04/08/14 0215 04/09/14 0227  WBC 6.8 10.2 9.6  --  11.5* 8.7  NEUTROABS 3.6  --   --   --   --   --   HGB 11.8* 10.1* 8.7* 10.6* 9.0* 8.0*  HCT 34.4* 29.7* 26.2* 32.0* 27.2* 24.5*  MCV 88.7 88.1 89.4  --  90.4 90.4  PLT 271 258 234  --  216 207    Medications:    . allopurinol  100 mg Oral Daily  . antiseptic oral rinse  15 mL Mouth Rinse BID  . aspirin EC  81 mg Oral Daily  . atorvastatin  20 mg Oral q1800  . carvedilol  25 mg Oral BID WC  . cloNIDine  0.3 mg Oral TID  . hydrALAZINE  25 mg Oral 3 times per day  . insulin aspart  0-9 Units Subcutaneous TID WC  . insulin glargine  20 Units Subcutaneous QHS  . pantoprazole  40 mg Oral BID  . sodium chloride  3 mL Intravenous Q12H      Otelia Santee, MD 04/09/2014, 4:41 PM

## 2014-04-09 NOTE — Progress Notes (Signed)
Subjective:  Continuing to show labile hypertension. Recently increased work of breathing, placed back on BiPAP.  Objective:  Vital Signs in the last 24 hours: Temp:  [98 F (36.7 C)-98.4 F (36.9 C)] 98 F (36.7 C) (07/20 1300) Pulse Rate:  [58-79] 60 (07/20 1100) Resp:  [14-25] 15 (07/20 1000) BP: (102-191)/(44-76) 186/76 mmHg (07/20 1245) SpO2:  [95 %-100 %] 100 % (07/20 1100) FiO2 (%):  [60 %] 60 % (07/20 1321) Weight:  [218 lb 0.6 oz (98.9 kg)] 218 lb 0.6 oz (98.9 kg) (07/20 0500)  Intake/Output from previous day: 07/19 0701 - 07/20 0700 In: 666.7 [P.O.:200; I.V.:400.7; IV Piggyback:66] Out: 8469 [Urine:1550]   Physical Exam: General: Well developed, well nourished, in mild respiratory distress. On BiPAP Head:  Normocephalic and atraumatic. Lungs: Crackles bilaterally, increased respiratory effort  Heart: Normal S1 and S2. Positive S4,  1/6 systolic  murmur apex,  no rubs or gallops.  Abdomen: soft, non-tender, positive bowel sounds. Extremities: No clubbing or cyanosis. No edema. Neurologic: Alert and oriented x 3.    Lab Results:  Recent Labs  04/08/14 0215 04/09/14 0227  WBC 11.5* 8.7  HGB 9.0* 8.0*  PLT 216 207    Recent Labs  04/08/14 0950 04/09/14 0227  NA 137 138  K 3.6* 3.5*  CL 98 100  CO2 20 23  GLUCOSE 246* 249*  BUN 38* 43*  CREATININE 3.06* 3.32*   Imaging: Dg Chest Port 1 View  04/09/2014   CLINICAL DATA:  Pulmonary edema  EXAM: PORTABLE CHEST - 1 VIEW  COMPARISON:  Portable chest x-ray of April 07, 2014  FINDINGS: The lung volumes remain low. The interstitial markings remain mildly increased but are less prominent. The cardiac silhouette remains enlarged. Its margins are better defined. The central pulmonary vascularity remains engorged but is also improved. The permanent pacemaker is unchanged in position. The bony thorax exhibits no acute change.  IMPRESSION: There has been mild interval decrease in the bilateral pulmonary  interstitial edema consistent with improving CHF.   Electronically Signed   By: David  Martinique   On: 04/09/2014 07:54   Personally viewed.   Telemetry: No adverse arrhythmias  Personally viewed.   EKG:  V. paced, sinus rhythm  Cardiac Studies:  Echocardiogram 04/06/14-50-55% EF, moderate LVH, severely dilated LA  Assessment/Plan:  Active Problems:   Hypertensive emergency   NSTEMI (non-ST elevated myocardial infarction)   Acute respiratory failure with hypoxia   Pulmonary edema   Malignant hypertension   1. Hypertensive emergency-blood pressures remained labile. She seems to deteriorate when blood pressures increase, causing increased afterload and worsening pulmonary status. She has received IV hydralazine. I have added hydralazine 25 mg 3 times a day. This may be up titrated to 50 mg soon if necessary.   I will also change her metoprolol to carvedilol to possibly add alpha inhibition. I will go ahead and give her goal dose carvedilol 25 mg twice a day given her significant hypertension. She may also benefit in future from amlodipine. Remember, she has pacemaker for backup.  I agree with holding her Lasix currently given her worsening acute kidney injury.   Could consider right heart catheterization/Swan-Ganz catheter to further clarify her volume status.  2. Non-ST elevation myocardial infarction-troponin was greater than 20. This is in the setting of hypertensive emergency. Ejection fraction reassuring. Heparin IV. No cardiac catheterization at this time given her acute worsening creatinine. Denies chest pain.   I have discussed plan with Dr. [name].  SKAINS, MARK  04/09/2014, 1:46 PM

## 2014-04-09 NOTE — Progress Notes (Signed)
ANTICOAGULATION CONSULT NOTE - Follow Up Consult  Pharmacy Consult for heparin Indication: MI  Labs:  Recent Labs  04/06/14 0737 04/06/14 1225  04/07/14 0230  04/07/14 1545 04/07/14 2055 04/08/14 0215 04/08/14 0950 04/09/14 0227  HGB  --   --   < > 8.7*  --  10.6*  --  9.0*  --  8.0*  HCT  --   --   < > 26.2*  --  32.0*  --  27.2*  --  24.5*  PLT  --   --   --  234  --   --   --  216  --  207  HEPARINUNFRC  --   --   < > 0.18*  < >  --  0.47 0.41  --  0.26*  CREATININE  --  2.60*  --   --   --   --   --   --  3.06*  --   TROPONINI >20.00*  --   --   --   --   --   --   --   --   --   < > = values in this interval not displayed.   Assessment: 68yo female now subtherapeutic on heparin after two levels at low end of goal, no issues with gtt per RN.  Goal of Therapy:  Heparin level 0.3-0.7 units/ml   Plan:  Will increase gtt by 1 unit/kg/hr to 1250 units/hr and check level in [place], PharmD, BCPS  04/09/2014,2:59 AM

## 2014-04-09 NOTE — Progress Notes (Signed)
ANTICOAGULATION CONSULT NOTE - Follow Up Consult  Pharmacy Consult for Heparin Indication: NSTEMI  Allergies  Allergen Reactions  . Simvastatin     May have caused nausea or itching  . Verapamil     Patient Measurements: Height: 5\' 4"  (162.6 cm) Weight: 218 lb 0.6 oz (98.9 kg) IBW/kg (Calculated) : 54.7 Heparin Dosing Weight: 77.5 kg  Vital Signs: Temp: 98.3 F (36.8 C) (07/20 2000) Temp src: Oral (07/20 2000) BP: 145/65 mmHg (07/20 2100) Pulse Rate: 74 (07/20 2100)  Labs:  Recent Labs  04/07/14 0230  04/07/14 1545  04/08/14 0215 04/08/14 0950 04/09/14 0227 04/09/14 1043 04/09/14 2100  HGB 8.7*  --  10.6*  --  9.0*  --  8.0*  --   --   HCT 26.2*  --  32.0*  --  27.2*  --  24.5*  --   --   PLT 234  --   --   --  216  --  207  --   --   HEPARINUNFRC 0.18*  < >  --   < > 0.41  --  0.26* 0.30 0.28*  CREATININE  --   --   --   --   --  3.06* 3.32*  --   --   < > = values in this interval not displayed.  Estimated Creatinine Clearance: 18.5 ml/min (by C-G formula based on Cr of 3.32).   Medications:  Heparin @ 1250 units/hr  Assessment: 68 YOF admitted with CP at [facility] and then transferred to [facility]. Was on heparin at [facility], stopped here. Then resumed per cardiology with elevated troponins, hypertensive emergency. May need cath eventually, but deferred due to renal function.   PM: HL came back at 0.28 this PM which is only slightly subtherapeutic.    Goal of Therapy:  Heparin level 0.3-0.7 units/ml Monitor platelets by anticoagulation protocol: Yes   Plan:   Increase heparin to 1400 units/hr F/u with AM Level

## 2014-04-09 NOTE — Clinical Documentation Improvement (Signed)
  Admitted with "hypertensive emergency" also documented "hypertensive urgency". In the Coding world these terms are considered nonspecific and equate to "benign hypertension". Please clarify these terms to reflect severity of illness and risk of mortality. Thank you.  Possible Clinical Conditions?  - Accelerated Hypertension - Malignant Hypertension - Other Condition__________  Supporting Information - On admit: 227/99, 224/84, 209/87 - NSTEMI this admit - Acute pulmonary edema/Acute on Chronic Diastolic CHF on admit  Thank You, Ezekiel Ina ,RN Clinical Documentation Specialist:  [facility] Information Management

## 2014-04-09 NOTE — Progress Notes (Signed)
UR Completed.  Alyssa [name] .tele 04/09/2014

## 2014-04-09 NOTE — Progress Notes (Signed)
Inpatient Diabetes Program Recommendations  AACE/ADA: New Consensus Statement on Inpatient Glycemic Control (2013)  Target Ranges:  Prepandial:   less than 140 mg/dL      Peak postprandial:   less than 180 mg/dL (1-2 hours)      Critically ill patients:  140 - 180 mg/dL   Results for CORISSA, OGUINN (MRN [id number]) as of 04/09/2014 10:24  Ref. Range 04/08/2014 07:10 04/08/2014 11:14 04/08/2014 22:42 04/09/2014 07:57  Glucose-Capillary Latest Range: 70-99 mg/dL 263 (H) 207 (H) 236 (H) 269 (H)   Diabetes history: DM2 Outpatient Diabetes medications: Humalog 75/25 55 units QAM with breakfast, Humalog 75/25 15 units QPM with supper  Current orders for Inpatient glycemic control: Novolog 0-9 units AC, Lantus 10 units QHS  Inpatient Diabetes Program Recommendations Insulin - Basal: Please consider increasing Lantus to 15 units QHS. HgbA1C: Please consider ordering an A1C to evaluate glycemic control over the past 2-3 months.  Thanks, Barnie Alderman, RN, MSN, CCRN Diabetes Coordinator Inpatient Diabetes Program [phone] (Team Pager) [phone] ([facility] office) [phone] [facility] office)

## 2014-04-09 NOTE — Progress Notes (Signed)
PULMONARY / CRITICAL CARE MEDICINE   Name: Alyssa [name] MRN: [id number] DOB: 01-23-1946    ADMISSION DATE:  04/05/2014 CONSULTATION DATE:  04/07/14  REFERRING MD :  CARD  PRIMARY SERVICE: CARD   CHIEF COMPLAINT:  Resp Distress, O2 sat 70%   BRIEF PATIENT DESCRIPTION:  Alyssa [name] is a 67 y.o.female history of sick sinus syndrome with Medtronic pacemaker,mild non-obstructive CAD by cath 2007, DM2, HTN, hyperlipidemia, renal cell CA with prior left nephrectomy admitted 7/17 with hypertensive emergency/NSTEMI started on Nitro drip .  PCCM called for consult 7/18 for resp distress with increased WOB and Hypoxia w/ apparent flash pulmonary edema   SIGNIFICANT EVENTS / STUDIES:  7/17 2 D echo EF 50-55%, LA severe dilation  7/18-pulmonary edema >BIPAP [facility] consult  7/20- still required NIMV, neg 886 cc  LINES / TUBES:  CULTURES:  ANTIBIOTICS:  SUBJECTIVE:   Improved on BIPAP yesterday, removed overnight however early am w/ episode of resp distress , hypoxia and severe HTN placed back on BIPAP   Improved , sitting up in bed , decreased dyspnea ,  Good UOP w/ lasix   VITAL SIGNS: Temp:  [98 F (36.7 C)-98.6 F (37 C)] 98.2 F (36.8 C) (07/20 0422) Pulse Rate:  [58-87] 63 (07/20 0600) Resp:  [12-25] 17 (07/20 0600) BP: (102-191)/(44-69) 162/64 mmHg (07/20 0600) SpO2:  [95 %-100 %] 100 % (07/20 0600) FiO2 (%):  [40 %-75 %] 60 % (07/20 0300) Weight:  [218 lb 0.6 oz (98.9 kg)] 218 lb 0.6 oz (98.9 kg) (07/20 0500) HEMODYNAMICS:   VENTILATOR SETTINGS: Vent Mode:  [-] BIPAP FiO2 (%):  [40 %-75 %] 60 % Set Rate:  [15 bmp] 15 bmp PEEP:  [8 cmH20] 8 cmH20 INTAKE / OUTPUT: Intake/Output     07/19 0701 - 07/20 0700 07/20 0701 - 07/21 0700   P.O. 200    I.V. (mL/kg) 386.4 (3.9)    Total Intake(mL/kg) 586.4 (5.9)    Urine (mL/kg/hr) 1550 (0.7)    Total Output 1550     Net -963.6            PHYSICAL EXAMINATION: GEN: Alert , sitting up in bed, moderate distress HEENT:  [place]/AT,     NECK:  Supple w/ fair ROM; JVD  RESP  Diminished BS in bases  CARD:  RRR, no m/r/g  , tr  peripheral edema, pulses intact, no cyanosis or clubbing. GI:   Soft & nt; BS+ ,obese  Musco: Warm bil, no deformities or joint swelling noted.  Neuro: alert, anxious  Skin: Warm, no lesions or rashes   LABS:  CBC  Recent Labs Lab 04/07/14 0230 04/07/14 1545 04/08/14 0215 04/09/14 0227  WBC 9.6  --  11.5* 8.7  HGB 8.7* 10.6* 9.0* 8.0*  HCT 26.2* 32.0* 27.2* 24.5*  PLT 234  --  216 207   Coag's  Recent Labs Lab 04/05/14 0749  APTT 28  INR 0.97   BMET  Recent Labs Lab 04/06/14 1225 04/08/14 0950 04/09/14 0227  NA 140 137 138  K 3.6* 3.6* 3.5*  CL 99 98 100  CO2 27 20 23   BUN 32* 38* 43*  CREATININE 2.60* 3.06* 3.32*  GLUCOSE 212* 246* 249*   Electrolytes  Recent Labs Lab 04/06/14 1225 04/08/14 0950 04/09/14 0227  CALCIUM 8.1* 8.1* 7.8*   Sepsis Markers No results found for this basename: LATICACIDVEN, PROCALCITON, O2SATVEN,  in the last 168 hours ABG No results found for this basename: PHART, PCO2ART, PO2ART,  in the last  168 hours Liver Enzymes  Recent Labs Lab 04/06/14 0200  AST 79*  ALT 23  ALKPHOS 87  BILITOT 0.3  ALBUMIN 2.9*   Cardiac Enzymes  Recent Labs Lab 04/05/14 0749  04/05/14 2015 04/06/14 0200 04/06/14 0737 04/08/14 0950  TROPONINI <0.30  < > >20.00* 13.18* >20.00*  --   PROBNP 2112.0*  --   --   --   --  4974.0*  < > = values in this interval not displayed. Glucose  Recent Labs Lab 04/07/14 1143 04/07/14 1615 04/07/14 2217 04/08/14 0710 04/08/14 1114 04/08/14 2242  GLUCAP 340* 301* 188* 263* 207* 236*    Imaging Dg Chest Port 1v Same Day  04/07/2014   CLINICAL DATA:  Shortness of breath.  Hypoxia.  EXAM: PORTABLE CHEST - 1 VIEW SAME DAY  COMPARISON:  04/05/2014  FINDINGS: Low lung volumes are again demonstrated. Heart size remains stable. Mild interstitial edema pattern again noted. There is mild worsening of  atelectasis or infiltrate in the lung bases bilaterally. No definite pleural effusion seen. Dual lead transvenous pacemaker posterior spinal fixation rods remain in place.  IMPRESSION: Stable low lung volumes and probable diffuse pulmonary interstitial edema. Mild worsening of atelectasis or infiltrates in both lung bases.   Electronically Signed   By: Earle Gell M.D.   On: 04/07/2014 12:58    CXR: 7/20  - int edema, fluid fissure right   ASSESSMENT / PLAN:  PULMONARY A: Acute Respiratory Failure with suspected acute pulmonary edema -improving on BIPAP /diuresis  P:   Diuresis w/ Lasix 80mg  IV x 1, caution further with crt rise With worsening acute distress will schedule BIPAP 4 hr on 6 hr off Check cxr in am   CARDIOVASCULAR A: Hypertensive Emergency  NSTEMI w/ elevated troponin /  7/17 Echo EF 50-55%, LA severe dilation, apical hypokinesis  P:  Cards following, I will hold lasix until cards re evals, crt rising Continue on Nitro drip Cont Hep Drip.  Goal neg I/O bal  Cont clonidine/metoprolol  sys goal less 140, need to increase maintenance meds to get off NTG drip  RENAL A: Acute on Chronic Renal failure (baseline ~2.1 09/2013), 3.32 today and rising  Intravascular status? Hx of renal cancer s/p nephrectomy  P:   Monitor I/O closely  AM bmet Dc lasix until cards re eval May need to place cvp line bnp assessment  GASTROINTESTINAL A:  GERD  P:   Cont PPI Diet for now ok  HEMATOLOGIC A: Anemia -chronic ? No sign of overt bleeding  Iron deficient , B12 nml  P:  Monitor h/h closely  Ferrous Sulfate, low threshold to dc this in the icu Hep drip  INFECTIOUS A:  No apparent infectious source  P:   Tr temp/wbc  pcxr for small broncho grams retrocardiac?  ENDOCRINE A:  DM -BS tr up, NOT within NICE window P:   SSI  Lantus 10U SQ daily, increase  NEUROLOGIC A:  Mentation improved on BIPAP  P:   Monitor closely   TODAY'S SUMMARY: hold lasix , crt rise, may  need line , cvp, NIMV scheduled to avoid acute exac = ett   Alyssa Roux, MD, MPH [place] PGY-2 04/09/2014 7:26 AM  Ccm time 30 min  I have fully examined this patient and agree with above findings.     [name]. [name], MD, [place] Pgr: [facility]

## 2014-04-09 NOTE — Progress Notes (Signed)
ANTICOAGULATION CONSULT NOTE - Follow Up Consult  Pharmacy Consult for Heparin Indication: NSTEMI  Allergies  Allergen Reactions  . Simvastatin     May have caused nausea or itching  . Verapamil     Patient Measurements: Height: 5\' 4"  (162.6 cm) Weight: 218 lb 0.6 oz (98.9 kg) IBW/kg (Calculated) : 54.7 Heparin Dosing Weight: 77.5 kg  Vital Signs: Temp: 98 F (36.7 C) (07/20 1300) Temp src: Oral (07/20 1300) BP: 186/76 mmHg (07/20 1245) Pulse Rate: 60 (07/20 1100)  Labs:  Recent Labs  04/07/14 0230  04/07/14 1545  04/08/14 0215 04/08/14 0950 04/09/14 0227 04/09/14 1043  HGB 8.7*  --  10.6*  --  9.0*  --  8.0*  --   HCT 26.2*  --  32.0*  --  27.2*  --  24.5*  --   PLT 234  --   --   --  216  --  207  --   HEPARINUNFRC 0.18*  < >  --   < > 0.41  --  0.26* 0.30  CREATININE  --   --   --   --   --  3.06* 3.32*  --   < > = values in this interval not displayed.  Estimated Creatinine Clearance: 18.5 ml/min (by C-G formula based on Cr of 3.32).   Medications:  Heparin @ 1250 units/hr  Assessment: 68 YOF admitted with CP at [facility] and then transferred to [facility]. Was on heparin at [facility], stopped here. Then resumed per cardiology with elevated troponins, hypertensive emergency. May need cath eventually, but deferred due to renal function.   Heparin level this morning was therapeutic after a rate adjustment this morning (HL 0.3 << 0.26, goal of 0.3-0.7). Hgb/Hct slight drop, plts wnl. No overt s/sx of bleeding noted.   Goal of Therapy:  Heparin level 0.3-0.7 units/ml Monitor platelets by anticoagulation protocol: Yes   Plan:  1. Increase heparin drip rate slightly to 1300 units/hr (13 ml/hr) 2. Will continue to monitor for any signs/symptoms of bleeding and will follow up with heparin level in 8 hours to confirm therapeutic  Alycia Rossetti, PharmD, BCPS Clinical Pharmacist Pager: [phone] 04/09/2014 1:40 PM

## 2014-04-10 ENCOUNTER — Inpatient Hospital Stay (HOSPITAL_COMMUNITY): Payer: Medicare PPO

## 2014-04-10 LAB — CBC WITH DIFFERENTIAL/PLATELET
Basophils Absolute: 0 10*3/uL (ref 0.0–0.1)
Basophils Relative: 0 % (ref 0–1)
EOS ABS: 0.2 10*3/uL (ref 0.0–0.7)
Eosinophils Relative: 3 % (ref 0–5)
HCT: 23.4 % — ABNORMAL LOW (ref 36.0–46.0)
HEMOGLOBIN: 7.7 g/dL — AB (ref 12.0–15.0)
LYMPHS ABS: 1.9 10*3/uL (ref 0.7–4.0)
Lymphocytes Relative: 26 % (ref 12–46)
MCH: 29.8 pg (ref 26.0–34.0)
MCHC: 32.9 g/dL (ref 30.0–36.0)
MCV: 90.7 fL (ref 78.0–100.0)
MONOS PCT: 10 % (ref 3–12)
Monocytes Absolute: 0.7 10*3/uL (ref 0.1–1.0)
Neutro Abs: 4.5 10*3/uL (ref 1.7–7.7)
Neutrophils Relative %: 61 % (ref 43–77)
Platelets: 227 10*3/uL (ref 150–400)
RBC: 2.58 MIL/uL — ABNORMAL LOW (ref 3.87–5.11)
RDW: 14 % (ref 11.5–15.5)
WBC: 7.4 10*3/uL (ref 4.0–10.5)

## 2014-04-10 LAB — URINE MICROSCOPIC-ADD ON

## 2014-04-10 LAB — COMPREHENSIVE METABOLIC PANEL
ALT: 12 U/L (ref 0–35)
AST: 14 U/L (ref 0–37)
Albumin: 2.1 g/dL — ABNORMAL LOW (ref 3.5–5.2)
Alkaline Phosphatase: 69 U/L (ref 39–117)
Anion gap: 15 (ref 5–15)
BUN: 48 mg/dL — ABNORMAL HIGH (ref 6–23)
CALCIUM: 8.1 mg/dL — AB (ref 8.4–10.5)
CO2: 25 mEq/L (ref 19–32)
Chloride: 98 mEq/L (ref 96–112)
Creatinine, Ser: 3.51 mg/dL — ABNORMAL HIGH (ref 0.50–1.10)
GFR calc Af Amer: 14 mL/min — ABNORMAL LOW (ref >90)
GFR calc non Af Amer: 12 mL/min — ABNORMAL LOW (ref >90)
Glucose, Bld: 240 mg/dL — ABNORMAL HIGH (ref 70–99)
Potassium: 3.6 mEq/L — ABNORMAL LOW (ref 3.7–5.3)
Sodium: 138 mEq/L (ref 137–147)
TOTAL PROTEIN: 5.9 g/dL — AB (ref 6.0–8.3)
Total Bilirubin: 0.4 mg/dL (ref 0.3–1.2)

## 2014-04-10 LAB — URINALYSIS, ROUTINE W REFLEX MICROSCOPIC
Bilirubin Urine: NEGATIVE
GLUCOSE, UA: 100 mg/dL — AB
HGB URINE DIPSTICK: NEGATIVE
Ketones, ur: NEGATIVE mg/dL
LEUKOCYTES UA: NEGATIVE
Nitrite: NEGATIVE
PH: 5.5 (ref 5.0–8.0)
PROTEIN: 100 mg/dL — AB
Specific Gravity, Urine: 1.017 (ref 1.005–1.030)
Urobilinogen, UA: 1 mg/dL (ref 0.0–1.0)

## 2014-04-10 LAB — GLUCOSE, CAPILLARY
GLUCOSE-CAPILLARY: 241 mg/dL — AB (ref 70–99)
GLUCOSE-CAPILLARY: 252 mg/dL — AB (ref 70–99)
GLUCOSE-CAPILLARY: 312 mg/dL — AB (ref 70–99)
GLUCOSE-CAPILLARY: 327 mg/dL — AB (ref 70–99)
Glucose-Capillary: 232 mg/dL — ABNORMAL HIGH (ref 70–99)

## 2014-04-10 LAB — PRO B NATRIURETIC PEPTIDE: PRO B NATRI PEPTIDE: 5125 pg/mL — AB (ref 0–125)

## 2014-04-10 LAB — HEPARIN LEVEL (UNFRACTIONATED)
HEPARIN UNFRACTIONATED: 0.33 [IU]/mL (ref 0.30–0.70)
Heparin Unfractionated: 0.48 IU/mL (ref 0.30–0.70)

## 2014-04-10 MED ORDER — INSULIN NPH (HUMAN) (ISOPHANE) 100 UNIT/ML ~~LOC~~ SUSP
5.0000 [IU] | Freq: Every day | SUBCUTANEOUS | Status: DC
  Administered 2014-04-10 – 2014-04-16 (×7): 5 [IU] via SUBCUTANEOUS
  Filled 2014-04-10 (×2): qty 10

## 2014-04-10 MED ORDER — INSULIN ASPART 100 UNIT/ML ~~LOC~~ SOLN
0.0000 [IU] | Freq: Every day | SUBCUTANEOUS | Status: DC
  Administered 2014-04-10: 4 [IU] via SUBCUTANEOUS

## 2014-04-10 MED ORDER — INSULIN ASPART 100 UNIT/ML ~~LOC~~ SOLN
0.0000 [IU] | Freq: Three times a day (TID) | SUBCUTANEOUS | Status: DC
  Administered 2014-04-10: 11 [IU] via SUBCUTANEOUS
  Administered 2014-04-10: 5 [IU] via SUBCUTANEOUS
  Administered 2014-04-11: 2 [IU] via SUBCUTANEOUS
  Administered 2014-04-11: 11 [IU] via SUBCUTANEOUS
  Administered 2014-04-11: 5 [IU] via SUBCUTANEOUS
  Administered 2014-04-12: 3 [IU] via SUBCUTANEOUS
  Administered 2014-04-12: 5 [IU] via SUBCUTANEOUS
  Administered 2014-04-12: 3 [IU] via SUBCUTANEOUS
  Administered 2014-04-13 (×2): 5 [IU] via SUBCUTANEOUS
  Administered 2014-04-13 – 2014-04-14 (×2): 3 [IU] via SUBCUTANEOUS
  Administered 2014-04-14: 2 [IU] via SUBCUTANEOUS
  Administered 2014-04-14: 3 [IU] via SUBCUTANEOUS
  Administered 2014-04-16 – 2014-04-17 (×3): 2 [IU] via SUBCUTANEOUS

## 2014-04-10 MED ORDER — INSULIN NPH (HUMAN) (ISOPHANE) 100 UNIT/ML ~~LOC~~ SUSP
40.0000 [IU] | Freq: Every day | SUBCUTANEOUS | Status: DC
  Administered 2014-04-11 – 2014-04-17 (×7): 40 [IU] via SUBCUTANEOUS
  Filled 2014-04-10 (×4): qty 10

## 2014-04-10 MED ORDER — INSULIN GLARGINE 100 UNIT/ML ~~LOC~~ SOLN: 25.0000 [IU] | Freq: Every day | SUBCUTANEOUS | Status: DC

## 2014-04-10 MED ORDER — AMLODIPINE BESYLATE 5 MG PO TABS
5.0000 mg | ORAL_TABLET | Freq: Every day | ORAL | Status: DC
  Administered 2014-04-10 – 2014-04-11 (×2): 5 mg via ORAL
  Filled 2014-04-10 (×3): qty 1

## 2014-04-10 NOTE — Progress Notes (Signed)
ANTICOAGULATION CONSULT NOTE - Follow Up Consult  Pharmacy Consult for heparin Indication: NSTEMI  Labs:  Recent Labs  04/08/14 0215 04/08/14 0950 04/09/14 0227 04/09/14 1043 04/09/14 2100 04/10/14 0226  HGB 9.0*  --  8.0*  --   --  7.7*  HCT 27.2*  --  24.5*  --   --  23.4*  PLT 216  --  207  --   --  227  HEPARINUNFRC 0.41  --  0.26* 0.30 0.28* 0.33  CREATININE  --  3.06* 3.32*  --   --   --     Assessment/Plan:  68yo female therapeutic on heparin after rate adjustment though level was drawn <3h after rate change. Will continue gtt at current rate and confirm stable with additional level.   Alyssa [name], PharmD, BCPS  04/10/2014,3:37 AM

## 2014-04-10 NOTE — Progress Notes (Signed)
Pt received into room 2w34, pt placed on tele, vitals taken, pt oriented to room and call bell, 4L [place], put is extremely short of breath upon bed transfer, will continue to monitor closely Rickard Rhymes, RN

## 2014-04-10 NOTE — Progress Notes (Signed)
PULMONARY / CRITICAL CARE MEDICINE   Name: Alyssa [name] MRN: [id number] DOB: 08-15-1946    ADMISSION DATE:  04/05/2014 CONSULTATION DATE:  04/07/14  REFERRING MD :  CARD  PRIMARY SERVICE: CARD   CHIEF COMPLAINT:  Resp Distress, O2 sat 70%   BRIEF PATIENT DESCRIPTION:  [name] is a 68 y.o.female history of sick sinus syndrome with Medtronic pacemaker,mild non-obstructive CAD by cath 2007, DM2, HTN, hyperlipidemia, renal cell CA with prior left nephrectomy admitted 7/17 with hypertensive emergency/NSTEMI started on Nitro drip .  PCCM called for consult 7/18 for resp distress with increased WOB and Hypoxia w/ apparent flash pulmonary edema   SIGNIFICANT EVENTS / STUDIES:  7/17 2 D echo EF 50-55%, LA severe dilation  7/18-pulmonary edema >BIPAP [facility] consult  7/20- still required NIMV, neg 886 cc 7/21- off bipap overnight, eating, NO SOB  LINES / TUBES:  CULTURES:  ANTIBIOTICS:  SUBJECTIVE:   No complaints this am  VITAL SIGNS: Temp:  [98 F (36.7 C)-98.7 F (37.1 C)] 98.7 F (37.1 C) (07/21 0405) Pulse Rate:  [56-86] 62 (07/21 0700) Resp:  [14-22] 14 (07/21 0700) BP: (96-186)/(40-76) 120/40 mmHg (07/21 0700) SpO2:  [96 %-100 %] 100 % (07/21 0700) FiO2 (%):  [60 %] 60 % (07/20 1800) Weight:  [213 lb 13.5 oz (97 kg)] 213 lb 13.5 oz (97 kg) (07/21 0356) HEMODYNAMICS:   VENTILATOR SETTINGS: Vent Mode:  [-] BIPAP FiO2 (%):  [60 %] 60 % Set Rate:  [15 bmp] 15 bmp PEEP:  [8 cmH20] 8 cmH20 INTAKE / OUTPUT: Intake/Output     07/20 0701 - 07/21 0700 07/21 0701 - 07/22 0700   P.O. 120    I.V. (mL/kg) 323.2 (3.3)    Other 110    IV Piggyback     Total Intake(mL/kg) 553.2 (5.7)    Urine (mL/kg/hr) 1085 (0.5)    Total Output 1085     Net -531.8            PHYSICAL EXAMINATION: GEN: Alert , sitting up in bed, moderate distress HEENT:  [place]/AT,    NECK:  Supple w/ fair ROM; JVD  RESP  Diminished BS in bases  CARD:  RRR, no m/r/g, tr  peripheral edema, pulses intact,  no cyanosis or clubbing. GI:   Soft & nt; BS+ ,obese  Musco: Warm bil, no deformities or joint swelling noted.  Neuro: alert, anxious  Skin: Warm, no lesions or rashes   LABS:  CBC  Recent Labs Lab 04/08/14 0215 04/09/14 0227 04/10/14 0226  WBC 11.5* 8.7 7.4  HGB 9.0* 8.0* 7.7*  HCT 27.2* 24.5* 23.4*  PLT 216 207 227   Coag's  Recent Labs Lab 04/05/14 0749  APTT 28  INR 0.97   BMET  Recent Labs Lab 04/08/14 0950 04/09/14 0227 04/10/14 0226  NA 137 138 138  K 3.6* 3.5* 3.6*  CL 98 100 98  CO2 20 23 25   BUN 38* 43* 48*  CREATININE 3.06* 3.32* 3.51*  GLUCOSE 246* 249* 240*   Electrolytes  Recent Labs Lab 04/08/14 0950 04/09/14 0227 04/10/14 0226  CALCIUM 8.1* 7.8* 8.1*   Sepsis Markers No results found for this basename: LATICACIDVEN, PROCALCITON, O2SATVEN,  in the last 168 hours ABG No results found for this basename: PHART, PCO2ART, PO2ART,  in the last 168 hours Liver Enzymes  Recent Labs Lab 04/06/14 0200 04/10/14 0226  AST 79* 14  ALT 23 12  ALKPHOS 87 69  BILITOT 0.3 0.4  ALBUMIN 2.9* 2.1*  Cardiac Enzymes  Recent Labs Lab 04/05/14 0749  04/05/14 2015 04/06/14 0200 04/06/14 0737 04/08/14 0950 04/10/14 0226  TROPONINI <0.30  < > >20.00* 13.18* >20.00*  --   --   PROBNP 2112.0*  --   --   --   --  4974.0* 5125.0*  < > = values in this interval not displayed. Glucose  Recent Labs Lab 04/08/14 2242 04/09/14 0757 04/09/14 1224 04/09/14 1600 04/09/14 2021 04/10/14 0018  GLUCAP 236* 269* 288* 247* 198* 252*    Imaging Dg Chest Port 1 View  04/10/2014   CLINICAL DATA:  Shortness of breath.  EXAM: PORTABLE CHEST - 1 VIEW  COMPARISON:  04/09/2014.  FINDINGS: The heart is enlarged but stable. Persistent pulmonary edema, unchanged. Small pleural effusions and bibasilar atelectasis persist.  IMPRESSION: Persistent CHF.   Electronically Signed   By: Kalman Jewels M.D.   On: 04/10/2014 07:24   Dg Chest Port 1 View  04/09/2014    CLINICAL DATA:  Pulmonary edema  EXAM: PORTABLE CHEST - 1 VIEW  COMPARISON:  Portable chest x-ray of April 07, 2014  FINDINGS: The lung volumes remain low. The interstitial markings remain mildly increased but are less prominent. The cardiac silhouette remains enlarged. Its margins are better defined. The central pulmonary vascularity remains engorged but is also improved. The permanent pacemaker is unchanged in position. The bony thorax exhibits no acute change.  IMPRESSION: There has been mild interval decrease in the bilateral pulmonary interstitial edema consistent with improving CHF.   Electronically Signed   By: David  Martinique   On: 04/09/2014 07:54    CXR: 7/20  - int edema, small volumes   ASSESSMENT / PLAN:  PULMONARY A: Acute Respiratory Failure with suspected acute pulmonary edema -improving on BIPAP /diuresis  P:   Dc scheduled BIPAP Check cxr in am  IS important  CARDIOVASCULAR A: Hypertensive Emergency  NSTEMI w/ elevated troponin /  7/17 Echo EF 50-55%, LA severe dilation, apical hypokinesis  P:  Cards following Keep Nitro drip off Cont Hep Drip Goal neg I/O bal  Cont clonidine/metoprolol  sys goal less 140, has some lows in 90's, some caution Add home norvasc  RENAL A: Acute on Chronic Renal failure (baseline ~2.1 09/2013), 3.32 today and rising  Intravascular status, feel is low - overdiuresis? Hx of renal cancer s/p nephrectomy  P:   Monitor I/O closely  AM bmet No lasix No ACEI Renal US Repeat UA, had rbc last  GASTROINTESTINAL A:  GERD  P:   Cont PPI Diet   HEMATOLOGIC A: Anemia -chronic ? No sign of overt bleeding  Iron deficient , B12 nml  P:  Monitor h/h closely  Ferrous Sulfate,restart when to floors Hep drip per cards  INFECTIOUS A:  No apparent infectious source  P:   folow fever curve  ENDOCRINE A:  DM -BS tr up, NOT within NICE window P:   SSI  Lantus transition to home NPH  NEUROLOGIC A:  Mentation improved on BIPAP  P:    Monitor closely   TODAY'S SUMMARY: hold lasix , UA, renal US, to tele, BP controlled, BIPAP off   [name], MD, MPH [place] PGY-2 04/10/2014 7:32 AM  I have fully examined this patient and agree with above findings.     [name]. [name], MD, [place] Pgr: [facility]

## 2014-04-10 NOTE — Care Management Note (Signed)
    Page 1 of 2   04/17/2014     4:13:42 PM CARE MANAGEMENT NOTE 04/17/2014  Patient:  Alyssa [name], Alyssa [name]   Account Number:  [id number]  Date Initiated:  04/06/2014  Documentation initiated by:  Luz Lex  Subjective/Objective Assessment:   NSTEMI - HTN - on NTG drip     Action/Plan:   Anticipated DC Date:  04/17/2014   Anticipated DC Plan:  [place]  CM consult      [facility] Choice  HOME HEALTH   Choice offered to / List presented to:  C-1 Patient        [place] arranged  [place] [facility]      [place].   Status of service:  Completed, signed off Medicare Important Message given?  YES (If response is "NO", the following Medicare IM given date fields will be blank) Date Medicare IM given:  04/10/2014 Medicare IM given by:  [facility] Date Additional Medicare IM given:  04/16/2014 Additional Medicare IM given by:  Ellan Lambert  Discharge Disposition:  [place]  Per UR Regulation:  Reviewed for med. necessity/level of care/duration of stay  If discussed at [place] of Stay Meetings, dates discussed:   04/12/2014  04/17/2014    Comments:  Contact:  Doorn,Cory Son [phone]                 Harriette Ohara Brother [phone]  04/17/14 Ellan Lambert, RN, BSN [phone] Pt for dc home today; will need HHPT follow up, as recommended.  Pt agreeable to this; referral to [facility], per pt choice.  Start of care 24-48h post dc date.  No DME needs, per pt.  04/12/14 Julian RN MSN BSN CCM NTG gtt weaned, remains on heparin gtt, adjusting antihypertensives.  04-10-14 [place], RNBSN - [phone] on [place] oxygen.  States feels better today.  States lives at home with son.  She was independent prior to coming in and hopes to go back home.  CM will continue to follow.   04-09-14 4pm Luz Lex, RNBSN [phone] Resp failure on 04-07-14 requiring bipap.  Remains off and on bipap.

## 2014-04-10 NOTE — Progress Notes (Signed)
[facility] Progress Note    Assessment/ Plan:   1 Acute on CKD- prob due to relative hypotension in this pt with prolonged hx of significant HTN, but there are continued episodes of uncontrolled hypertension requiring uptitration temporarily with the nitroglycerin.  -- agree that we are more than 24 hours out but the intermittent episodes of worsening hypertension is concerning.  -- will also check for metanephrines, catecholamines and VMA.  -- rbc's in the urine likely from the malignant hypertension which can cause MAHA as well as proteinuria + dysmorphic RBC's in the urine.  -- may start amlodipine 5mg  daily as well if BP is not better controlled overnight --> would hold off for now; BP still very episodic and I worry that these drops in blood pressure is altering perfusion leading to the renal dysfunction. Agree with holding the Lasix but I'm not convinced the worsening of renal function is volume related vs a problem with autoregulation. -- no indication for dialysis at this time and hopefully she will turn around. With a baseline CKD IV she has less reserve and may require temporary dialysis. 2 CKD stage IV- baseline creat 1.9- 2.1, due to DM/HTN, f/b Dr Hinda Lenis in [place]  3 Hx L nephrectomy '01 for RCC  4 Pulm edema- agree with holding the diuretics. 5 Obesity    Subjective:   Complains of dyspnea during the episodes of worsening hypertension.  She denies chest pain or diaphoresis.  No rashes, photophobia or sinus problems.   She actually feels better today overall.    Objective:   BP 116/50  Pulse 63  Temp(Src) 98.2 F (36.8 C) (Oral)  Resp 13  Ht 5\' 4"  (1.626 m)  Wt 97 kg (213 lb 13.5 oz)  BMI 36.69 kg/m2  SpO2 98%  Intake/Output Summary (Last 24 hours) at 04/10/14 1154 Last data filed at 04/10/14 0900  Gross per 24 hour  Intake 719.18 ml  Output    710 ml  Net   9.18 ml   Weight change: -1.9 kg (-4 lb 3 oz)  Physical Exam: Alert, obese  pleasant AAF in no distress  No rash, cyanosis  Sclera anicteric, throat clear  No thyromegaly, no bruits  Chest rales 1/3 up on R, L base mostly clear  RRR no MRG  Abd obese, soft, NTND, no ascites or edema  Trace lower edema  No ulcer or gangrene  Neuro is nf, Ox 3, no asterixis   Imaging: Dg Chest Port 1 View  04/10/2014   CLINICAL DATA:  Shortness of breath.  EXAM: PORTABLE CHEST - 1 VIEW  COMPARISON:  04/09/2014.  FINDINGS: The heart is enlarged but stable. Persistent pulmonary edema, unchanged. Small pleural effusions and bibasilar atelectasis persist.  IMPRESSION: Persistent CHF.   Electronically Signed   By: Kalman Jewels M.D.   On: 04/10/2014 07:24   Dg Chest Port 1 View  04/09/2014   CLINICAL DATA:  Pulmonary edema  EXAM: PORTABLE CHEST - 1 VIEW  COMPARISON:  Portable chest x-ray of April 07, 2014  FINDINGS: The lung volumes remain low. The interstitial markings remain mildly increased but are less prominent. The cardiac silhouette remains enlarged. Its margins are better defined. The central pulmonary vascularity remains engorged but is also improved. The permanent pacemaker is unchanged in position. The bony thorax exhibits no acute change.  IMPRESSION: There has been mild interval decrease in the bilateral pulmonary interstitial edema consistent with improving CHF.   Electronically Signed   By: David  Martinique  On: 04/09/2014 07:54    Labs: BMET  Recent Labs Lab 04/05/14 0749 04/06/14 0200 04/06/14 1225 04/08/14 0950 04/09/14 0227 04/10/14 0226  NA 144 139 140 137 138 138  K 3.4* 3.7 3.6* 3.6* 3.5* 3.6*  CL 105 100 99 98 100 98  CO2 25 24 27 20 23 25   GLUCOSE 64* 350* 212* 246* 249* 240*  BUN 31* 29* 32* 38* 43* 48*  CREATININE 2.21* 2.31* 2.60* 3.06* 3.32* 3.51*  CALCIUM 9.3 8.5 8.1* 8.1* 7.8* 8.1*   CBC  Recent Labs Lab 04/05/14 0749  04/07/14 0230 04/07/14 1545 04/08/14 0215 04/09/14 0227 04/10/14 0226  WBC 6.8  < > 9.6  --  11.5* 8.7 7.4  NEUTROABS  3.6  --   --   --   --   --  4.5  HGB 11.8*  < > 8.7* 10.6* 9.0* 8.0* 7.7*  HCT 34.4*  < > 26.2* 32.0* 27.2* 24.5* 23.4*  MCV 88.7  < > 89.4  --  90.4 90.4 90.7  PLT 271  < > 234  --  216 207 227  < > = values in this interval not displayed.  Medications:    . allopurinol  100 mg Oral Daily  . amLODipine  5 mg Oral Daily  . antiseptic oral rinse  15 mL Mouth Rinse BID  . aspirin EC  81 mg Oral Daily  . atorvastatin  20 mg Oral q1800  . carvedilol  25 mg Oral BID WC  . cloNIDine  0.3 mg Oral TID  . insulin aspart  0-15 Units Subcutaneous TID WC  . insulin aspart  0-5 Units Subcutaneous QHS  . [START ON 04/11/2014] insulin NPH Human  40 Units Subcutaneous QAC breakfast  . insulin NPH Human  5 Units Subcutaneous QHS  . pantoprazole  40 mg Oral BID  . sodium chloride  3 mL Intravenous Q12H      Otelia Santee, MD 04/10/2014, 11:54 AM

## 2014-04-10 NOTE — Progress Notes (Signed)
ANTICOAGULATION CONSULT NOTE - Follow Up Consult  Pharmacy Consult for Heparin Indication: NSTEMI  Allergies  Allergen Reactions  . Simvastatin     May have caused nausea or itching  . Verapamil     Patient Measurements: Height: 5\' 4"  (162.6 cm) Weight: 213 lb 13.5 oz (97 kg) IBW/kg (Calculated) : 54.7 Heparin Dosing Weight: 77.5 kg  Vital Signs: Temp: 98.2 F (36.8 C) (07/21 0833) Temp src: Oral (07/21 0833) BP: 116/50 mmHg (07/21 1115) Pulse Rate: 63 (07/21 1115)  Labs:  Recent Labs  04/08/14 0215 04/08/14 0950 04/09/14 0227  04/09/14 2100 04/10/14 0226 04/10/14 1002  HGB 9.0*  --  8.0*  --   --  7.7*  --   HCT 27.2*  --  24.5*  --   --  23.4*  --   PLT 216  --  207  --   --  227  --   HEPARINUNFRC 0.41  --  0.26*  < > 0.28* 0.33 0.48  CREATININE  --  3.06* 3.32*  --   --  3.51*  --   < > = values in this interval not displayed.  Estimated Creatinine Clearance: 17.3 ml/min (by C-G formula based on Cr of 3.51).   Medications:  Heparin @ 1250 units/hr  Assessment: 68 YOF admitted with CP at [facility] and then transferred to [facility]. Was on heparin at [facility], stopped here. Then resumed per cardiology with elevated troponins, hypertensive emergency. May need cath eventually, but deferred due to renal function.   Heparin level this morning remains therapeutic (HL 0.48 << 0.3, goal of 0.3-0.7). Hgb/Hct slight drop, plts wnl. No overt s/sx of bleeding noted.   Goal of Therapy:  Heparin level 0.3-0.7 units/ml Monitor platelets by anticoagulation protocol: Yes   Plan:  1. Continue heparin of 1400 units/hr (14 ml/hr)  2. Will continue to monitor for any signs/symptoms of bleeding and will follow up with heparin level in the a.m.   Alycia Rossetti, PharmD, BCPS Clinical Pharmacist Pager: [phone] 04/10/2014 11:38 AM

## 2014-04-10 NOTE — Progress Notes (Signed)
Subjective:  Off of BiPAP, breathing better. Blood pressure under better control.  Objective:  Vital Signs in the last 24 hours: Temp:  [98 F (36.7 C)-98.7 F (37.1 C)] 98.2 F (36.8 C) (07/21 0833) Pulse Rate:  [56-86] 63 (07/21 1115) Resp:  [13-22] 13 (07/21 1115) BP: (96-187)/(40-79) 116/50 mmHg (07/21 1115) SpO2:  [96 %-100 %] 98 % (07/21 1115) FiO2 (%):  [60 %] 60 % (07/20 1800) Weight:  [213 lb 13.5 oz (97 kg)] 213 lb 13.5 oz (97 kg) (07/21 0356)  Intake/Output from previous day: 07/20 0701 - 07/21 0700 In: 577.2 [P.O.:120; I.V.:337.2] Out: 1085 [Urine:1085]   Physical Exam: General: Well developed, well nourished, norespiratory distress.  Head:  Normocephalic and atraumatic. Lungs: no significant crackles.  Heart: Normal S1 and S2. Positive S4,  1/6 systolic  murmur apex,  no rubs or gallops.  Abdomen: soft, non-tender, positive bowel sounds. Extremities: No clubbing or cyanosis. No edema. Neurologic: Alert and oriented x 3.    Lab Results:  Recent Labs  04/09/14 0227 04/10/14 0226  WBC 8.7 7.4  HGB 8.0* 7.7*  PLT 207 227    Recent Labs  04/09/14 0227 04/10/14 0226  NA 138 138  K 3.5* 3.6*  CL 100 98  CO2 23 25  GLUCOSE 249* 240*  BUN 43* 48*  CREATININE 3.32* 3.51*   Imaging: Dg Chest Port 1 View  04/10/2014   CLINICAL DATA:  Shortness of breath.  EXAM: PORTABLE CHEST - 1 VIEW  COMPARISON:  04/09/2014.  FINDINGS: The heart is enlarged but stable. Persistent pulmonary edema, unchanged. Small pleural effusions and bibasilar atelectasis persist.  IMPRESSION: Persistent CHF.   Electronically Signed   By: Kalman Jewels M.D.   On: 04/10/2014 07:24   Dg Chest Port 1 View  04/09/2014   CLINICAL DATA:  Pulmonary edema  EXAM: PORTABLE CHEST - 1 VIEW  COMPARISON:  Portable chest x-ray of April 07, 2014  FINDINGS: The lung volumes remain low. The interstitial markings remain mildly increased but are less prominent. The cardiac silhouette remains  enlarged. Its margins are better defined. The central pulmonary vascularity remains engorged but is also improved. The permanent pacemaker is unchanged in position. The bony thorax exhibits no acute change.  IMPRESSION: There has been mild interval decrease in the bilateral pulmonary interstitial edema consistent with improving CHF.   Electronically Signed   By: David  Martinique   On: 04/09/2014 07:54   Personally viewed.   Telemetry: No adverse arrhythmias  V pacing Personally viewed.   EKG:  V. paced, sinus rhythm  Cardiac Studies:  Echocardiogram 04/06/14-50-55% EF, moderate LVH, severely dilated LA . allopurinol  100 mg Oral Daily  . amLODipine  5 mg Oral Daily  . antiseptic oral rinse  15 mL Mouth Rinse BID  . aspirin EC  81 mg Oral Daily  . atorvastatin  20 mg Oral q1800  . carvedilol  25 mg Oral BID WC  . cloNIDine  0.3 mg Oral TID  . insulin aspart  0-15 Units Subcutaneous TID WC  . insulin aspart  0-5 Units Subcutaneous QHS  . [START ON 04/11/2014] insulin NPH Human  40 Units Subcutaneous QAC breakfast  . insulin NPH Human  5 Units Subcutaneous QHS  . pantoprazole  40 mg Oral BID  . sodium chloride  3 mL Intravenous Q12H    Assessment/Plan:  Active Problems:   Hypertensive emergency   NSTEMI (non-ST elevated myocardial infarction)   Acute respiratory failure with hypoxia  Pulmonary edema   Malignant hypertension  Alyssa [name] is a 68 y.o.female history of sick sinus syndrome with Medtronic pacemaker,mild non-obstructive CAD by cath 2007, DM2, HTN, hyperlipidemia, renal cell CA with prior left nephrectomy admitted 7/17 with hypertensive emergency/NSTEMI   1. Hypertensive emergency-amlodipine, carvedilol, hydralazine, clonidine. Much improved. When blood pressure elevates, this increases her afterload which results in pulmonary edema. Remember, she has pacemaker for backup.  I agree with holding her Lasix currently given her worsening acute kidney injury.   Catecholamines VMA,  metanephrine has been drawn at the request of nephrology.  2. Non-ST elevation myocardial infarction-troponin was greater than 20. This is in the setting of hypertensive emergency. Ejection fraction reassuring. No cardiac catheterization at this time given her acute worsening creatinine. Denies chest pain. Continue to treat her medically.  She has been on IV heparin since 04/05/14. I will go ahead and discontinue.  3. Acute on chronic kidney injury-nephrology following. Creatinine continues to rise.    I have discussed plan with Dr. [name]. Agree with transfer.  SKAINS, [place] 04/10/2014, 11:42 AM

## 2014-04-11 ENCOUNTER — Encounter (INDEPENDENT_AMBULATORY_CARE_PROVIDER_SITE_OTHER): Payer: Medicare PPO | Admitting: Ophthalmology

## 2014-04-11 LAB — BASIC METABOLIC PANEL
Anion gap: 13 (ref 5–15)
BUN: 55 mg/dL — AB (ref 6–23)
CALCIUM: 8.3 mg/dL — AB (ref 8.4–10.5)
CHLORIDE: 96 meq/L (ref 96–112)
CO2: 26 meq/L (ref 19–32)
CREATININE: 3.3 mg/dL — AB (ref 0.50–1.10)
GFR calc Af Amer: 15 mL/min — ABNORMAL LOW (ref >90)
GFR calc non Af Amer: 13 mL/min — ABNORMAL LOW (ref >90)
Glucose, Bld: 302 mg/dL — ABNORMAL HIGH (ref 70–99)
Potassium: 4.3 mEq/L (ref 3.7–5.3)
Sodium: 135 mEq/L — ABNORMAL LOW (ref 137–147)

## 2014-04-11 LAB — CBC
HCT: 24.6 % — ABNORMAL LOW (ref 36.0–46.0)
HEMOGLOBIN: 8.1 g/dL — AB (ref 12.0–15.0)
MCH: 30.3 pg (ref 26.0–34.0)
MCHC: 32.9 g/dL (ref 30.0–36.0)
MCV: 92.1 fL (ref 78.0–100.0)
PLATELETS: 190 10*3/uL (ref 150–400)
RBC: 2.67 MIL/uL — ABNORMAL LOW (ref 3.87–5.11)
RDW: 13.8 % (ref 11.5–15.5)
WBC: 6 10*3/uL (ref 4.0–10.5)

## 2014-04-11 LAB — HEPARIN LEVEL (UNFRACTIONATED)

## 2014-04-11 LAB — GLUCOSE, CAPILLARY
Glucose-Capillary: 230 mg/dL — ABNORMAL HIGH (ref 70–99)
Glucose-Capillary: 316 mg/dL — ABNORMAL HIGH (ref 70–99)
Glucose-Capillary: 163 mg/dL — ABNORMAL HIGH (ref 70–99)

## 2014-04-11 NOTE — Progress Notes (Signed)
PULMONARY / CRITICAL CARE MEDICINE   Name: Alyssa [name] MRN: [id number] DOB: [birth year]    ADMISSION DATE:  04/05/2014 CONSULTATION DATE:  04/07/14  REFERRING MD :  CARD  PRIMARY SERVICE: CARD   CHIEF COMPLAINT:  Resp Distress, O2 sat 70%   BRIEF PATIENT DESCRIPTION:  Alyssa [name] is a 68 y.o.female history of sick sinus syndrome with Medtronic pacemaker,mild non-obstructive CAD by cath 2007, DM2, HTN, hyperlipidemia, renal cell CA with prior left nephrectomy admitted 7/17 with hypertensive emergency/NSTEMI started on Nitro drip .  PCCM called for consult 7/18 for resp distress with increased WOB and Hypoxia w/ apparent flash pulmonary edema   SIGNIFICANT EVENTS / STUDIES:  7/17 2 D echo EF 50-55%, LA severe dilation  7/18-pulmonary edema >BIPAP [facility] consult  7/20- still required NIMV, neg 886 cc 7/21- off bipap overnight, eating, NO SOB  LINES / TUBES:  CULTURES:  ANTIBIOTICS:  SUBJECTIVE:   No complaints this am.  Feeling much better.  Moved out to floor.   VITAL SIGNS: Temp:  [97.7 F (36.5 C)-98.5 F (36.9 C)] 97.7 F (36.5 C) (07/22 0456) Pulse Rate:  [61-97] 62 (07/22 0848) Resp:  [13-18] 18 (07/22 0456) BP: (111-174)/(46-84) 140/50 mmHg (07/22 0848) SpO2:  [98 %-100 %] 100 % (07/22 0848) Weight:  [222 lb 14.2 oz (101.1 kg)] 222 lb 14.2 oz (101.1 kg) (07/22 0456) 4L [facility]   INTAKE / OUTPUT: Intake/Output     07/21 0701 - 07/22 0700 07/22 0701 - 07/23 0700   P.O. 430 240   I.V. (mL/kg) 84 (0.8)    Other 60    Total Intake(mL/kg) 574 (5.7) 240 (2.4)   Urine (mL/kg/hr) 710 (0.3)    Total Output 710     Net -136 +240          PHYSICAL EXAMINATION: GEN: Alert , sitting up in bed, NAD, pleasant  HEENT:  [place]/AT,    NECK:  Supple w/ fair ROM; JVD  RESP resps even non labored on 4L, Diminished BS in bases  CARD:  RRR, no m/r/g, tr  peripheral edema, pulses intact, no cyanosis or clubbing. GI:   Soft & nt; BS+ ,obese  Musco: Warm bil, no deformities or joint  swelling noted.  Neuro: alert, anxious  Skin: Warm, no lesions or rashes   LABS:  CBC  Recent Labs Lab 04/09/14 0227 04/10/14 0226 04/11/14 0413  WBC 8.7 7.4 6.0  HGB 8.0* 7.7* 8.1*  HCT 24.5* 23.4* 24.6*  PLT 207 227 190   Coag's  Recent Labs Lab 04/05/14 0749  APTT 28  INR 0.97   BMET  Recent Labs Lab 04/08/14 0950 04/09/14 0227 04/10/14 0226  NA 137 138 138  K 3.6* 3.5* 3.6*  CL 98 100 98  CO2 20 23 25   BUN 38* 43* 48*  CREATININE 3.06* 3.32* 3.51*  GLUCOSE 246* 249* 240*   Electrolytes  Recent Labs Lab 04/08/14 0950 04/09/14 0227 04/10/14 0226  CALCIUM 8.1* 7.8* 8.1*   Sepsis Markers No results found for this basename: LATICACIDVEN, PROCALCITON, O2SATVEN,  in the last 168 hours ABG No results found for this basename: PHART, PCO2ART, PO2ART,  in the last 168 hours Liver Enzymes  Recent Labs Lab 04/06/14 0200 04/10/14 0226  AST 79* 14  ALT 23 12  ALKPHOS 87 69  BILITOT 0.3 0.4  ALBUMIN 2.9* 2.1*   Cardiac Enzymes  Recent Labs Lab 04/05/14 0749  04/05/14 2015 04/06/14 0200 04/06/14 0737 04/08/14 0950 04/10/14 0226  TROPONINI <0.30  < > >  20.00* 13.18* >20.00*  --   --   PROBNP 2112.0*  --   --   --   --  4974.0* 5125.0*  < > = values in this interval not displayed. Glucose  Recent Labs Lab 04/10/14 0018 04/10/14 0755 04/10/14 1228 04/10/14 1620 04/10/14 2132 04/11/14 0711  GLUCAP 252* 232* 312* 241* 327* 230*    Imaging US Renal  04/10/2014   CLINICAL DATA:  Acute kidney injury.  Previous left nephrectomy.  EXAM: RENAL/URINARY TRACT ULTRASOUND COMPLETE  COMPARISON:  None.  FINDINGS: Right Kidney:  Length: 11.8 cm. Diffusely increased parenchymal echogenicity noted. No mass or hydronephrosis visualized.  Left Kidney:  Length: Surgically absent.  Bladder:  Empty with Foley catheter in place.  IMPRESSION: Previous left nephrectomy.  Increased parenchymal echogenicity of the right kidney, consistent with medical renal disease.  No evidence of hydronephrosis.   Electronically Signed   By: Earle Gell M.D.   On: 04/10/2014 17:50   Dg Chest Port 1 View  04/10/2014   CLINICAL DATA:  Shortness of breath.  EXAM: PORTABLE CHEST - 1 VIEW  COMPARISON:  04/09/2014.  FINDINGS: The heart is enlarged but stable. Persistent pulmonary edema, unchanged. Small pleural effusions and bibasilar atelectasis persist.  IMPRESSION: Persistent CHF.   Electronically Signed   By: Kalman Jewels M.D.   On: 04/10/2014 07:24      ASSESSMENT / PLAN:  PULMONARY A: Acute Respiratory Failure with suspected acute pulmonary edema -Improving with diuresis.  P:   pulm hygiene  Cont diuresis per cards - on hold for now with worsening renal function  Titrate O2 to keep sats >90%  Ambulatory desat prior to d/c  CARDIOVASCULAR A: Hypertensive Emergency  NSTEMI w/ elevated troponin /  7/17 Echo EF 50-55%, LA severe dilation, apical hypokinesis  P:  Cards primary Off heparin gtt  Goal neg I/O bal  Cont coreg, hydralazine, clonidine per cards  Holding home norvasc per renal recs  No cath at this time with worsening AKI  RENAL A: Acute on Chronic Renal failure (baseline ~2.1 09/2013), 3.32 today and rising  Hx of renal cancer s/p nephrectomy 2001 P:   Monitor I/O closely  No lasix No ACEI Renal following - no acute indication HD but may require short term    GASTROINTESTINAL A:  GERD  P:   Cont PPI Diet   HEMATOLOGIC A: Anemia -chronic ? No sign of overt bleeding  Iron deficient , B12 nml  P:  Monitor h/h closely  SCD's  INFECTIOUS A:  No apparent infectious source  P:   folow fever curve  ENDOCRINE A:  DM -BS tr up, NOT within NICE window P:   SSI  Lantus transition to home NPH  NEUROLOGIC A:  Mentation improved on BIPAP  P:   Monitor closely     PCCM signing off, please call back if needed    Nickolas Madrid, NP 04/11/2014  10:33 AM Pager: [phone] or [phone]  *Care during the described time  interval was provided by me and/or other providers on the critical care team. I have reviewed this patient's available data, including medical history, events of note, physical examination and test results as part of my evaluation.  Rush Farmer, M.D. [facility] Pulmonary/Critical Care Medicine. Pager: [phone]. After hours pager: [phone].

## 2014-04-11 NOTE — Progress Notes (Signed)
[facility] Progress Note    Assessment/ Plan:   1 Acute on CKD- prob due to relative hypotension in this pt with prolonged hx of significant HTN, but there are continued episodes of uncontrolled hypertension requiring uptitration temporarily with the nitroglycerin.  -- agree that we are more than 24 hours out but the intermittent episodes of worsening hypertension is concerning.  -- will also check for metanephrines, catecholamines and VMA --> pending.  -- rbc's in the urine likely from the malignant hypertension which can cause MAHA as well as proteinuria + dysmorphic RBC's in the urine.  -- Continuet amlodipine 5mg  daily; BP still episodic but not as big changes.  --Would not increase antihypertensives at this time and allow her to equilibrate to maintain perfusion.  -- no indication for dialysis at this time and her renal function has stabilized. -- With a baseline CKD IV she has less reserve and may require temporary dialysis.  2 CKD stage IV- baseline creat 1.9- 2.1, due to DM/HTN, f/b Dr Hinda Lenis in [place]  3 Hx L nephrectomy '01 for RCC  4 Pulm edema- agree with holding the diuretics.  5 Obesity    Subjective:   She denies chest pain or diaphoresis.  No rashes, photophobia or sinus problems.  She feels better today overall.    Objective:   BP 140/50  Pulse 62  Temp(Src) 97.7 F (36.5 C) (Oral)  Resp 18  Ht 5\' 4"  (1.626 m)  Wt 101.1 kg (222 lb 14.2 oz)  BMI 38.24 kg/m2  SpO2 100%  Intake/Output Summary (Last 24 hours) at 04/11/14 1533 Last data filed at 04/11/14 0820  Gross per 24 hour  Intake    390 ml  Output    400 ml  Net    -10 ml   Weight change: 4.1 kg (9 lb 0.6 oz)  Physical Exam: Alert, obese pleasant AAF in no distress  No rash, cyanosis  Sclera anicteric, throat clear  No thyromegaly, no bruits  Chest rales 1/3 up on R, L base mostly clear  RRR no MRG  Abd obese, soft, NTND, no ascites or edema  Trace lower edema  No ulcer  or gangrene  Neuro is nf, Ox 3, no asterixis   Imaging: US Renal  04/10/2014   CLINICAL DATA:  Acute kidney injury.  Previous left nephrectomy.  EXAM: RENAL/URINARY TRACT ULTRASOUND COMPLETE  COMPARISON:  None.  FINDINGS: Right Kidney:  Length: 11.8 cm. Diffusely increased parenchymal echogenicity noted. No mass or hydronephrosis visualized.  Left Kidney:  Length: Surgically absent.  Bladder:  Empty with Foley catheter in place.  IMPRESSION: Previous left nephrectomy.  Increased parenchymal echogenicity of the right kidney, consistent with medical renal disease. No evidence of hydronephrosis.   Electronically Signed   By: Earle Gell M.D.   On: 04/10/2014 17:50   Dg Chest Port 1 View  04/10/2014   CLINICAL DATA:  Shortness of breath.  EXAM: PORTABLE CHEST - 1 VIEW  COMPARISON:  04/09/2014.  FINDINGS: The heart is enlarged but stable. Persistent pulmonary edema, unchanged. Small pleural effusions and bibasilar atelectasis persist.  IMPRESSION: Persistent CHF.   Electronically Signed   By: Kalman Jewels M.D.   On: 04/10/2014 07:24    Labs: BMET  Recent Labs Lab 04/05/14 0749 04/06/14 0200 04/06/14 1225 04/08/14 0950 04/09/14 0227 04/10/14 0226 04/11/14 1144  NA 144 139 140 137 138 138 135*  K 3.4* 3.7 3.6* 3.6* 3.5* 3.6* 4.3  CL 105 100 99 98 100 98 96  CO2 25 24 27 20 23 25 26   GLUCOSE 64* 350* 212* 246* 249* 240* 302*  BUN 31* 29* 32* 38* 43* 48* 55*  CREATININE 2.21* 2.31* 2.60* 3.06* 3.32* 3.51* 3.30*  CALCIUM 9.3 8.5 8.1* 8.1* 7.8* 8.1* 8.3*   CBC  Recent Labs Lab 04/05/14 0749  04/08/14 0215 04/09/14 0227 04/10/14 0226 04/11/14 0413  WBC 6.8  < > 11.5* 8.7 7.4 6.0  NEUTROABS 3.6  --   --   --  4.5  --   HGB 11.8*  < > 9.0* 8.0* 7.7* 8.1*  HCT 34.4*  < > 27.2* 24.5* 23.4* 24.6*  MCV 88.7  < > 90.4 90.4 90.7 92.1  PLT 271  < > 216 207 227 190  < > = values in this interval not displayed.  Medications:    . allopurinol  100 mg Oral Daily  . amLODipine  5 mg  Oral Daily  . antiseptic oral rinse  15 mL Mouth Rinse BID  . aspirin EC  81 mg Oral Daily  . atorvastatin  20 mg Oral q1800  . carvedilol  25 mg Oral BID WC  . cloNIDine  0.3 mg Oral TID  . insulin aspart  0-15 Units Subcutaneous TID WC  . insulin aspart  0-5 Units Subcutaneous QHS  . insulin NPH Human  40 Units Subcutaneous QAC breakfast  . insulin NPH Human  5 Units Subcutaneous QHS  . pantoprazole  40 mg Oral BID  . sodium chloride  3 mL Intravenous Q12H      Otelia Santee, MD 04/11/2014, 3:33 PM

## 2014-04-11 NOTE — Progress Notes (Signed)
Subjective:  States breathing better. Blood pressure under better control. Minimal cough.   Objective:  Vital Signs in the last 24 hours: Temp:  [97.7 F (36.5 C)-98.5 F (36.9 C)] 97.7 F (36.5 C) (07/22 0456) Pulse Rate:  [61-97] 62 (07/22 0848) Resp:  [13-18] 18 (07/22 0456) BP: (111-174)/(46-84) 140/50 mmHg (07/22 0848) SpO2:  [98 %-100 %] 100 % (07/22 0848) Weight:  [222 lb 14.2 oz (101.1 kg)] 222 lb 14.2 oz (101.1 kg) (07/22 0456)  Intake/Output from previous day: 07/21 0701 - 07/22 0700 In: 574 [P.O.:430; I.V.:84] Out: 710 [Urine:710]   Physical Exam: General: Well developed, well nourished, no respiratory distress.  Head:  Normocephalic and atraumatic. Lungs: Clear  Heart: Normal S1 and S2. Positive S4,  1/6 systolic  Murmur at apex,  no rubs or gallops.  Abdomen: soft, non-tender, positive bowel sounds. Extremities: No clubbing or cyanosis. No edema. Neurologic: Alert and oriented x 3.    Lab Results:  Recent Labs  04/10/14 0226 04/11/14 0413  WBC 7.4 6.0  HGB 7.7* 8.1*  PLT 227 190    Recent Labs  04/09/14 0227 04/10/14 0226  NA 138 138  K 3.5* 3.6*  CL 100 98  CO2 23 25  GLUCOSE 249* 240*  BUN 43* 48*  CREATININE 3.32* 3.51*   Imaging: US Renal  04/10/2014   CLINICAL DATA:  Acute kidney injury.  Previous left nephrectomy.  EXAM: RENAL/URINARY TRACT ULTRASOUND COMPLETE  COMPARISON:  None.  FINDINGS: Right Kidney:  Length: 11.8 cm. Diffusely increased parenchymal echogenicity noted. No mass or hydronephrosis visualized.  Left Kidney:  Length: Surgically absent.  Bladder:  Empty with Foley catheter in place.  IMPRESSION: Previous left nephrectomy.  Increased parenchymal echogenicity of the right kidney, consistent with medical renal disease. No evidence of hydronephrosis.   Electronically Signed   By: Earle Gell M.D.   On: 04/10/2014 17:50   Dg Chest Port 1 View  04/10/2014   CLINICAL DATA:  Shortness of breath.  EXAM: PORTABLE CHEST - 1 VIEW   COMPARISON:  04/09/2014.  FINDINGS: The heart is enlarged but stable. Persistent pulmonary edema, unchanged. Small pleural effusions and bibasilar atelectasis persist.  IMPRESSION: Persistent CHF.   Electronically Signed   By: Kalman Jewels M.D.   On: 04/10/2014 07:24    Telemetry: No adverse arrhythmias  V pacing Personally viewed.     Cardiac Studies:  Echocardiogram 04/06/14-50-55% EF, moderate LVH, severely dilated LA  . allopurinol  100 mg Oral Daily  . amLODipine  5 mg Oral Daily  . antiseptic oral rinse  15 mL Mouth Rinse BID  . aspirin EC  81 mg Oral Daily  . atorvastatin  20 mg Oral q1800  . carvedilol  25 mg Oral BID WC  . cloNIDine  0.3 mg Oral TID  . insulin aspart  0-15 Units Subcutaneous TID WC  . insulin aspart  0-5 Units Subcutaneous QHS  . insulin NPH Human  40 Units Subcutaneous QAC breakfast  . insulin NPH Human  5 Units Subcutaneous QHS  . pantoprazole  40 mg Oral BID  . sodium chloride  3 mL Intravenous Q12H    Assessment/Plan:  Active Problems:   Hypertensive emergency   NSTEMI (non-ST elevated myocardial infarction)   Acute respiratory failure with hypoxia   Pulmonary edema   Malignant hypertension  Ms. Halls is a 68 y.o.female history of sick sinus syndrome with Medtronic pacemaker,mild non-obstructive CAD by cath 2007, DM2, HTN, hyperlipidemia, renal cell CA with prior left  nephrectomy admitted 7/17 with hypertensive emergency/NSTEMI   1. Hypertensive emergency-amlodipine, carvedilol, hydralazine, clonidine. Much improved. When blood pressure elevates, this increases her afterload which results in pulmonary edema.   Holding her Lasix currently given her worsening acute kidney injury.   Catecholamines VMA, metanephrine has been drawn at the request of nephrology.  2. Non-ST elevation myocardial infarction-troponin was greater than 20. This is in the setting of hypertensive emergency. Ejection fraction reassuring. No cardiac catheterization at this time  given her acute worsening creatinine. Denies chest pain. Continue to treat her medically.   3. Acute on chronic kidney injury stage 4-nephrology following. BMET not ordered today. Will order now and check daily.   4. Deconditioning. Will advance activity with assistance. Ask PT to evaluate and treat.   5. S/p left nephrectomy  6. Obesity.   Collier [facility] 04/11/2014, 11:04 AM

## 2014-04-12 DIAGNOSIS — N179 Acute kidney failure, unspecified: Secondary | ICD-10-CM

## 2014-04-12 DIAGNOSIS — E119 Type 2 diabetes mellitus without complications: Secondary | ICD-10-CM

## 2014-04-12 DIAGNOSIS — N189 Chronic kidney disease, unspecified: Secondary | ICD-10-CM

## 2014-04-12 LAB — GLUCOSE, CAPILLARY
GLUCOSE-CAPILLARY: 186 mg/dL — AB (ref 70–99)
Glucose-Capillary: 264 mg/dL — ABNORMAL HIGH (ref 70–99)
Glucose-Capillary: 180 mg/dL — ABNORMAL HIGH (ref 70–99)
Glucose-Capillary: 193 mg/dL — ABNORMAL HIGH (ref 70–99)

## 2014-04-12 LAB — CBC
HEMATOCRIT: 22.6 % — AB (ref 36.0–46.0)
Hemoglobin: 7.5 g/dL — ABNORMAL LOW (ref 12.0–15.0)
MCH: 29.9 pg (ref 26.0–34.0)
MCHC: 33.2 g/dL (ref 30.0–36.0)
MCV: 90 fL (ref 78.0–100.0)
Platelets: 219 10*3/uL (ref 150–400)
RBC: 2.51 MIL/uL — ABNORMAL LOW (ref 3.87–5.11)
RDW: 13.6 % (ref 11.5–15.5)
WBC: 7.2 10*3/uL (ref 4.0–10.5)

## 2014-04-12 LAB — BASIC METABOLIC PANEL
Anion gap: 14 (ref 5–15)
BUN: 57 mg/dL — ABNORMAL HIGH (ref 6–23)
CO2: 25 mEq/L (ref 19–32)
Calcium: 8.3 mg/dL — ABNORMAL LOW (ref 8.4–10.5)
Chloride: 98 mEq/L (ref 96–112)
Creatinine, Ser: 3.05 mg/dL — ABNORMAL HIGH (ref 0.50–1.10)
GFR calc non Af Amer: 15 mL/min — ABNORMAL LOW (ref >90)
GFR, EST AFRICAN AMERICAN: 17 mL/min — AB (ref >90)
GLUCOSE: 181 mg/dL — AB (ref 70–99)
POTASSIUM: 3.9 meq/L (ref 3.7–5.3)
Sodium: 137 mEq/L (ref 137–147)

## 2014-04-12 MED ORDER — AMLODIPINE BESYLATE 5 MG PO TABS
7.5000 mg | ORAL_TABLET | Freq: Every day | ORAL | Status: DC
  Administered 2014-04-12: 7.5 mg via ORAL
  Filled 2014-04-12 (×2): qty 1

## 2014-04-12 MED ORDER — ALUM & MAG HYDROXIDE-SIMETH 200-200-20 MG/5ML PO SUSP
30.0000 mL | Freq: Three times a day (TID) | ORAL | Status: DC | PRN
  Administered 2014-04-12: 30 mL via ORAL
  Filled 2014-04-12: qty 30

## 2014-04-12 MED ORDER — COLCHICINE 0.6 MG PO TABS
0.6000 mg | ORAL_TABLET | Freq: Two times a day (BID) | ORAL | Status: DC
  Administered 2014-04-12 – 2014-04-17 (×11): 0.6 mg via ORAL
  Filled 2014-04-12 (×13): qty 1

## 2014-04-12 MED ORDER — BISACODYL 5 MG PO TBEC
10.0000 mg | DELAYED_RELEASE_TABLET | Freq: Every day | ORAL | Status: DC | PRN
  Administered 2014-04-12: 10 mg via ORAL
  Filled 2014-04-12: qty 2

## 2014-04-12 NOTE — Progress Notes (Signed)
Inpatient Diabetes Program Recommendations  AACE/ADA: New Consensus Statement on Inpatient Glycemic Control (2013)  Target Ranges:  Prepandial:   less than 140 mg/dL      Peak postprandial:   less than 180 mg/dL (1-2 hours)      Critically ill patients:  140 - 180 mg/dL   Reason for Assessment:  Results for MINTIE, WITHERINGTON (MRN [id number]) as of 04/12/2014 14:04  Ref. Range 04/11/2014 07:11 04/11/2014 11:32 04/11/2014 21:04 04/12/2014 06:24 04/12/2014 11:34  Glucose-Capillary Latest Range: 70-99 mg/dL 230 (H) 316 (H) 163 (H) 186 (H) 264 (H)     Please consider adding Novolog meal coverage 5 units tid with meals (Hold if patient eats less than 50%) while in the hospital.  At discharge, she may resume her home dose of Humalog 75/25.   Thanks, Adah Perl, RN, BC-ADM Inpatient Diabetes Coordinator Pager [phone]

## 2014-04-12 NOTE — Progress Notes (Signed)
Patient Name: Alyssa [name] Date of Encounter: 04/12/2014  Active Problems:   Hypertensive emergency   NSTEMI (non-ST elevated myocardial infarction)   Acute respiratory failure with hypoxia   Pulmonary edema   Malignant hypertension    Patient Profile: Alyssa [name] is a 67 y.o.female history of SSS with MDT PPM, mild non-obstructive CAD by cath 2007, DM2, HTN, CKD IV, hyperlipidemia, renal cell CA with prior left nephrectomy admitted 7/17 with hypertensive emergency/NSTEMI    SUBJECTIVE: Feels OK today, no chest pain, no SOB. Has not been OOB since admission, sat up to eat last pm.  OBJECTIVE Filed Vitals:   04/11/14 1500 04/11/14 2106 04/11/14 2309 04/12/14 0457  BP: 149/55 197/69 132/45 159/61  Pulse: 63 76 65 60  Temp: 98.1 F (36.7 C) 99 F (37.2 C)  97.6 F (36.4 C)  TempSrc: Oral Oral  Oral  Resp: 18 18  18   Height:      Weight:    229 lb 8 oz (104.1 kg)  SpO2: 100% 100%  100%    Intake/Output Summary (Last 24 hours) at 04/12/14 0830 Last data filed at 04/12/14 0505  Gross per 24 hour  Intake    240 ml  Output   1500 ml  Net  -1260 ml   Filed Weights   04/10/14 0356 04/11/14 0456 04/12/14 0457  Weight: 213 lb 13.5 oz (97 kg) 222 lb 14.2 oz (101.1 kg) 229 lb 8 oz (104.1 kg)    PHYSICAL EXAM General: Well developed, well nourished, female in no acute distress. Head: Normocephalic, atraumatic.  Neck: Supple without bruits, JVD at 9 cm. Lungs:  Resp regular and unlabored, rales bases. Heart: RRR, S1, S2, no S3, S4, or murmur; no rub. Abdomen: Soft, non-tender, non-distended, BS + x 4.  Extremities: No clubbing, cyanosis, no edema.  Neuro: Alert and oriented X 3. Moves all extremities spontaneously. Psych: Normal affect.  LABS: CBC: Recent Labs  04/10/14 0226 04/11/14 0413 04/12/14 0244  WBC 7.4 6.0 7.2  NEUTROABS 4.5  --   --   HGB 7.7* 8.1* 7.5*  HCT 23.4* 24.6* 22.6*  MCV 90.7 92.1 90.0  PLT 227 190 741   Basic Metabolic Panel: Recent  Labs  04/11/14 1144 04/12/14 0244  NA 135* 137  K 4.3 3.9  CL 96 98  CO2 26 25  GLUCOSE 302* 181*  BUN 55* 57*  CREATININE 3.30* 3.05*  CALCIUM 8.3* 8.3*   Liver Function Tests: Recent Labs  04/10/14 0226  AST 14  ALT 12  ALKPHOS 69  BILITOT 0.4  PROT 5.9*  ALBUMIN 2.1*   Lab Results  Component Value Date   TROPONINI >20.00* 04/06/2014   BNP: Pro B Natriuretic peptide (BNP)  Date/Time Value Ref Range Status  04/10/2014  2:26 AM 5125.0* 0 - 125 pg/mL Final  04/08/2014  9:50 AM 4974.0* 0 - 125 pg/mL Final   TELE:   AV pacing, rare PVCs  Radiology/Studies: US Renal 04/10/2014   CLINICAL DATA:  Acute kidney injury.  Previous left nephrectomy.  EXAM: RENAL/URINARY TRACT ULTRASOUND COMPLETE  COMPARISON:  None.  FINDINGS: Right Kidney:  Length: 11.8 cm. Diffusely increased parenchymal echogenicity noted. No mass or hydronephrosis visualized.  Left Kidney:  Length: Surgically absent.  Bladder:  Empty with Foley catheter in place.  IMPRESSION: Previous left nephrectomy.  Increased parenchymal echogenicity of the right kidney, consistent with medical renal disease. No evidence of hydronephrosis.   Electronically Signed   By: Sharrie Rothman.D.  On: 04/10/2014 17:50    Current Medications:  . allopurinol  100 mg Oral Daily  . amLODipine  5 mg Oral Daily  . antiseptic oral rinse  15 mL Mouth Rinse BID  . aspirin EC  81 mg Oral Daily  . atorvastatin  20 mg Oral q1800  . carvedilol  25 mg Oral BID WC  . cloNIDine  0.3 mg Oral TID  . insulin aspart  0-15 Units Subcutaneous TID WC  . insulin aspart  0-5 Units Subcutaneous QHS  . insulin NPH Human  40 Units Subcutaneous QAC breakfast  . insulin NPH Human  5 Units Subcutaneous QHS  . pantoprazole  40 mg Oral BID  . sodium chloride  3 mL Intravenous Q12H      ASSESSMENT AND PLAN:   NSTEMI (non-ST elevated myocardial infarction) - No cath due to poor renal function, on ASA, BB, statin, EF 50-55%. Off IV nitrates. OK to increase  activity, will ask cardiac rehab to see.  Active Problems:   Hypertensive emergency - SBP range 197-116 last 24 hours on current rx. SBP generally 120s-150s    Acute respiratory failure with hypoxia - Improved, per IM    Pulmonary edema - No longer on diuretics, will leave volume management to nephrology/IM.    Malignant hypertension - improved, per IM   Signed, Rosaria Ferries , PA-C 8:30 AM 04/12/2014 Patient seen and examined and history reviewed. Agree with above findings and plan. Feels OK. BP still spiking at times. Patient thinks pain from gout flare may have caused BP to spike last night. C/o gout pain left 1st MTP. Dyspnea is improved. Does not appear volume overloaded. Creatinine is slowly improving. Catecholamines are pending. Will increase amlodipine to 7.5 mg daily. Start colchicine for gout. Advance activity with PT. Appreciate Renal input.  Griffen Frayne [place], [place] 04/12/2014 9:24 AM

## 2014-04-12 NOTE — Progress Notes (Signed)
Physical Therapy Evaluation Patient Details Name: JANIFER GIESELMAN MRN: [id number] DOB: 26-Sep-1945 Today's Date: 04/12/2014   History of Present Illness  Pt admit with HTN emergency.  Pt also with gout.    Clinical Impression  Pt admitted with above. Pt currently with functional limitations due to the deficits listed below (see PT Problem List).  Pt will benefit from skilled PT to increase their independence and safety with mobility to allow discharge to the venue listed below.     Follow Up Recommendations Home health PT;Supervision/Assistance - 24 hour    Equipment Recommendations  None recommended by PT    Recommendations for Other Services       Precautions / Restrictions Precautions Precautions: Fall Restrictions Weight Bearing Restrictions: No      Mobility  Bed Mobility Overal bed mobility: Needs Assistance Bed Mobility: Supine to Sit     Supine to sit: Min assist     General bed mobility comments: Min assist for elevation of trunk and scooting to EOB.    Transfers Overall transfer level: Needs assistance Equipment used: None;1 person hand held assist Transfers: Sit to/from Stand Sit to Stand: Min assist         General transfer comment: Pt needed min assist secondary to gout pain in left > right knee and foot.    Ambulation/Gait Ambulation/Gait assistance: Min assist;Mod assist Ambulation Distance (Feet): 15 Feet Assistive device: 1 person hand held assist Gait Pattern/deviations: Step-to pattern;Decreased stride length;Decreased step length - left;Decreased stance time - left;Decreased weight shift to left;Wide base of support;Trunk flexed;Antalgic   Gait velocity interpretation: Below normal speed for age/gender General Gait Details: Pt with antalgic gait due to gout.  Pt needing HHA and also reaching for furniture in room.  only able to ambulate around bed.  Flexed posture and wide BOS.  Gettting gout meds but MD just increased the dosage.    Stairs             Wheelchair Mobility    Modified Rankin (Stroke Patients Only)       Balance Overall balance assessment: Needs assistance;History of Falls Sitting-balance support: No upper extremity supported;Feet supported Sitting balance-Leahy Scale: Good     Standing balance support: During functional activity;Bilateral upper extremity supported Standing balance-Leahy Scale: Poor Standing balance comment: Needed incr assist for balance due to foot and knee pain.                              Pertinent Vitals/Pain O2 at rest 96%.  LEft O2 off after treatment as pt did not desat with activity or at rest. HR 74-108 bpm with activity. Nursing aware.  Gout pain 8/10 per pt and nursing and MD aware.      Home Living Family/patient expects to be discharged to:: Private residence Living Arrangements: Children (son) Available Help at Discharge: Family;Available 24 hours/day (son had back surgery so cAN'T LIFT) Type of Home: House Home Access: Level entry     Home Layout: One level Home Equipment: Walker - 4 wheels;Cane - single point      Prior Function Level of Independence: Independent with assistive device(s)         Comments: used rollator in house and cane outdoors     Hand Dominance   Dominant Hand: Right    Extremity/Trunk Assessment   Upper Extremity Assessment: Defer to OT evaluation           Lower Extremity Assessment: Generalized  weakness      Cervical / Trunk Assessment: Normal  Communication   Communication: No difficulties  Cognition Arousal/Alertness: Awake/alert Behavior During Therapy: WFL for tasks assessed/performed Overall Cognitive Status: Within Functional Limits for tasks assessed                      General Comments      Exercises General Exercises - Lower Extremity Ankle Circles/Pumps: AROM;Both;10 reps;Seated Long Arc Quad: AROM;Both;10 reps;Seated      Assessment/Plan    PT Assessment Patient  needs continued PT services  PT Diagnosis Generalized weakness   PT Problem List Decreased activity tolerance;Decreased balance;Decreased mobility;Decreased knowledge of use of DME;Decreased safety awareness;Decreased knowledge of precautions;Pain  PT Treatment Interventions DME instruction;Gait training;Functional mobility training;Therapeutic exercise;Therapeutic activities;Patient/family education   PT Goals (Current goals can be found in the Care Plan section) Acute Rehab PT Goals Patient Stated Goal: to go home PT Goal Formulation: With patient Time For Goal Achievement: 04/19/14 Potential to Achieve Goals: Good    Frequency Min 3X/week   Barriers to discharge        Co-evaluation               End of Session Equipment Utilized During Treatment: Gait belt;Oxygen Activity Tolerance: Patient limited by fatigue Patient left: in chair;with call bell/phone within reach;with nursing/sitter in room Nurse Communication: Mobility status         Time: 2446-2863 PT Time Calculation (min): 22 min   Charges:   PT Evaluation $Initial PT Evaluation Tier I: 1 Procedure PT Treatments $Gait Training: 8-22 mins   PT G Codes:          INGOLD,Delno Blaisdell 12-May-2014, 12:00 PM [place],PT Acute Rehabilitation [phone] (pager)

## 2014-04-12 NOTE — Progress Notes (Signed)
[facility] Progress Note    Assessment/ Plan:   1 Acute on CKD- prob due to relative hypotension in this pt with prolonged hx of significant HTN, but there are continued episodes of uncontrolled hypertension requiring uptitration temporarily with the nitroglycerin.  -- agree that we are more than 24 hours out but the intermittent episodes of worsening hypertension is concerning.  -- will also check for metanephrines, catecholamines and VMA --> pending.  -- rbc's in the urine likely from the malignant hypertension which can cause MAHA as well as proteinuria + dysmorphic RBC's in the urine --> clearing on repeat u/a.  -- Continue amlodipine 5mg  daily; BP still episodic but not as big changes.  --Would not increase antihypertensives at this time and allow her to equilibrate to maintain perfusion.  -- no indication for dialysis at this time and her renal function has stabilized.  -- With a baseline CKD IV she has less reserve and may require temporary dialysis --> fortunately urine output is very good with a downward trend in the Cr. -- just needs BMET qod given the good uop.  2 CKD stage IV- baseline creat 1.9- 2.1, due to DM/HTN, f/b Dr Hinda Lenis in [place]  3 Hx L nephrectomy '01 for RCC  4 Pulm edema- agree with holding the diuretics.  5 Obesity    Subjective:   She denies chest pain or diaphoresis.  No rashes, photophobia or sinus problems.  She feels better today overall.  She complains of left great toe pain starting yesterday (she has a history of gout).   Objective:   BP 159/61  Pulse 60  Temp(Src) 97.6 F (36.4 C) (Oral)  Resp 18  Ht 5\' 4"  (1.626 m)  Wt 104.1 kg (229 lb 8 oz)  BMI 39.37 kg/m2  SpO2 100%  Intake/Output Summary (Last 24 hours) at 04/12/14 1019 Last data filed at 04/12/14 0900  Gross per 24 hour  Intake    480 ml  Output   1500 ml  Net  -1020 ml   Weight change: 3 kg (6 lb 9.8 oz)  Physical Exam: Alert, obese pleasant AAF in no  distress  No rash, cyanosis  Sclera anicteric, throat clear  No thyromegaly, no bruits  Chest rales 1/3 up on R, L base mostly clear  RRR no MRG  Abd obese, soft, NTND, no ascites or edema  Trace lower edema  No ulcer or gangrene  Neuro is nf, Ox 3, no asterixis   Imaging: US Renal  04/10/2014   CLINICAL DATA:  Acute kidney injury.  Previous left nephrectomy.  EXAM: RENAL/URINARY TRACT ULTRASOUND COMPLETE  COMPARISON:  None.  FINDINGS: Right Kidney:  Length: 11.8 cm. Diffusely increased parenchymal echogenicity noted. No mass or hydronephrosis visualized.  Left Kidney:  Length: Surgically absent.  Bladder:  Empty with Foley catheter in place.  IMPRESSION: Previous left nephrectomy.  Increased parenchymal echogenicity of the right kidney, consistent with medical renal disease. No evidence of hydronephrosis.   Electronically Signed   By: Earle Gell M.D.   On: 04/10/2014 17:50    Labs: BMET  Recent Labs Lab 04/06/14 0200 04/06/14 1225 04/08/14 0950 04/09/14 0227 04/10/14 0226 04/11/14 1144 04/12/14 0244  NA 139 140 137 138 138 135* 137  K 3.7 3.6* 3.6* 3.5* 3.6* 4.3 3.9  CL 100 99 98 100 98 96 98  CO2 24 27 20 23 25 26 25   GLUCOSE 350* 212* 246* 249* 240* 302* 181*  BUN 29* 32* 38* 43* 48* 55*  57*  CREATININE 2.31* 2.60* 3.06* 3.32* 3.51* 3.30* 3.05*  CALCIUM 8.5 8.1* 8.1* 7.8* 8.1* 8.3* 8.3*   CBC  Recent Labs Lab 04/09/14 0227 04/10/14 0226 04/11/14 0413 04/12/14 0244  WBC 8.7 7.4 6.0 7.2  NEUTROABS  --  4.5  --   --   HGB 8.0* 7.7* 8.1* 7.5*  HCT 24.5* 23.4* 24.6* 22.6*  MCV 90.4 90.7 92.1 90.0  PLT 207 227 190 219    Medications:    . allopurinol  100 mg Oral Daily  . amLODipine  7.5 mg Oral Daily  . antiseptic oral rinse  15 mL Mouth Rinse BID  . aspirin EC  81 mg Oral Daily  . atorvastatin  20 mg Oral q1800  . carvedilol  25 mg Oral BID WC  . cloNIDine  0.3 mg Oral TID  . colchicine  0.6 mg Oral BID  . insulin aspart  0-15 Units Subcutaneous TID WC   . insulin aspart  0-5 Units Subcutaneous QHS  . insulin NPH Human  40 Units Subcutaneous QAC breakfast  . insulin NPH Human  5 Units Subcutaneous QHS  . pantoprazole  40 mg Oral BID  . sodium chloride  3 mL Intravenous Q12H      Otelia Santee, MD 04/12/2014, 10:19 AM

## 2014-04-12 NOTE — Progress Notes (Signed)
CARDIAC REHAB PHASE I   PRE:  Rate/Rhythm: SR 67 paced  BP:  Supine:   Sitting:162/67   Standing:    SaO2: 97 RA  MODE:  Ambulation: 50 ft up from chair to door of room   POST:  Rate/Rhythm: 67  BP:  Supine:   Sitting: 167/97  Standing:    SaO2:96 RA  Pt up from chair in room to door of room x 1 assist, RW.  Pt unable to progress further with ambulation due to pain in left foot from gout.  Pt complained unable to bear weight on foot.  Pt also complained of Left knee pain when walking.  Pt assisted back to chair, call bell within reach, denies any sob - O2 remains off.  Pt hopes to progress with ambulation when gout in foot improves.  Maurice Small RN, BSN [phone]

## 2014-04-13 ENCOUNTER — Encounter (INDEPENDENT_AMBULATORY_CARE_PROVIDER_SITE_OTHER): Payer: Medicare PPO | Admitting: Ophthalmology

## 2014-04-13 DIAGNOSIS — M1030 Gout due to renal impairment, unspecified site: Secondary | ICD-10-CM

## 2014-04-13 LAB — BASIC METABOLIC PANEL
Anion gap: 14 (ref 5–15)
BUN: 53 mg/dL — AB (ref 6–23)
CALCIUM: 8.7 mg/dL (ref 8.4–10.5)
CHLORIDE: 100 meq/L (ref 96–112)
CO2: 24 mEq/L (ref 19–32)
CREATININE: 2.78 mg/dL — AB (ref 0.50–1.10)
GFR calc Af Amer: 19 mL/min — ABNORMAL LOW (ref >90)
GFR, EST NON AFRICAN AMERICAN: 16 mL/min — AB (ref >90)
Glucose, Bld: 166 mg/dL — ABNORMAL HIGH (ref 70–99)
Potassium: 4.2 mEq/L (ref 3.7–5.3)
Sodium: 138 mEq/L (ref 137–147)

## 2014-04-13 LAB — CBC
HEMATOCRIT: 25.1 % — AB (ref 36.0–46.0)
Hemoglobin: 8.2 g/dL — ABNORMAL LOW (ref 12.0–15.0)
MCH: 29.6 pg (ref 26.0–34.0)
MCHC: 32.7 g/dL (ref 30.0–36.0)
MCV: 90.6 fL (ref 78.0–100.0)
PLATELETS: 246 10*3/uL (ref 150–400)
RBC: 2.77 MIL/uL — ABNORMAL LOW (ref 3.87–5.11)
RDW: 13.6 % (ref 11.5–15.5)
WBC: 7.4 10*3/uL (ref 4.0–10.5)

## 2014-04-13 LAB — GLUCOSE, CAPILLARY
GLUCOSE-CAPILLARY: 184 mg/dL — AB (ref 70–99)
Glucose-Capillary: 216 mg/dL — ABNORMAL HIGH (ref 70–99)
Glucose-Capillary: 209 mg/dL — ABNORMAL HIGH (ref 70–99)
Glucose-Capillary: 118 mg/dL — ABNORMAL HIGH (ref 70–99)

## 2014-04-13 LAB — METANEPHRINES, URINE, 24 HOUR
METANEPHRINES UR: 90 ug/(24.h) (ref 90–315)
Metaneph Total, Ur: 448 mcg/24 h (ref 224–832)
NORMETANEPHRINE 24H UR: 358 ug/(24.h) (ref 122–676)
VOLUME, URINE-METAN: 800 mL

## 2014-04-13 MED ORDER — AMLODIPINE BESYLATE 10 MG PO TABS
10.0000 mg | ORAL_TABLET | Freq: Every day | ORAL | Status: DC
  Administered 2014-04-13 – 2014-04-17 (×5): 10 mg via ORAL
  Filled 2014-04-13 (×6): qty 1

## 2014-04-13 MED ORDER — INSULIN ASPART 100 UNIT/ML ~~LOC~~ SOLN
5.0000 [IU] | Freq: Three times a day (TID) | SUBCUTANEOUS | Status: DC
  Administered 2014-04-13 – 2014-04-17 (×11): 5 [IU] via SUBCUTANEOUS

## 2014-04-13 MED ORDER — FUROSEMIDE 20 MG PO TABS
20.0000 mg | ORAL_TABLET | Freq: Two times a day (BID) | ORAL | Status: DC
  Administered 2014-04-13 – 2014-04-17 (×8): 20 mg via ORAL
  Filled 2014-04-13 (×11): qty 1

## 2014-04-13 NOTE — Progress Notes (Addendum)
Patient Name: Alyssa [name] Date of Encounter: 04/13/2014  Active Problems:   Hypertensive emergency   NSTEMI (non-ST elevated myocardial infarction)   Acute respiratory failure with hypoxia   Pulmonary edema   Malignant hypertension    Patient Profile: Alyssa [name] is a 68 y.o.female history of SSS with MDT PPM, mild non-obstructive CAD by cath 2007, DM2, HTN, CKD IV, hyperlipidemia, renal cell CA with prior left nephrectomy admitted 7/17 with hypertensive emergency/NSTEMI    SUBJECTIVE: Feels better today, no chest pain, no SOB. Did get up with PT yesterday. Gout pain is better today. Still spiking BP at times.  OBJECTIVE Filed Vitals:   04/12/14 1356 04/12/14 2151 04/12/14 2338 04/13/14 0543  BP: 136/55 162/62  187/66  Pulse: 61 67 64 72  Temp: 98.2 F (36.8 C) 98.5 F (36.9 C)  98.1 F (36.7 C)  TempSrc: Oral Oral  Oral  Resp: 18 18 16 18   Height:      Weight:    222 lb 3.6 oz (100.8 kg)  SpO2: 99% 98% 98% 96%    Intake/Output Summary (Last 24 hours) at 04/13/14 0802 Last data filed at 04/12/14 1900  Gross per 24 hour  Intake    780 ml  Output    900 ml  Net   -120 ml   Filed Weights   04/11/14 0456 04/12/14 0457 04/13/14 0543  Weight: 222 lb 14.2 oz (101.1 kg) 229 lb 8 oz (104.1 kg) 222 lb 3.6 oz (100.8 kg)    PHYSICAL EXAM General: Well developed, well nourished, female in no acute distress. Head: Normocephalic, atraumatic.  Neck: Supple without bruits, JVD at 9 cm. Lungs:  Resp regular and unlabored, rales bases. Heart: RRR, S1, S2, no S3, S4, or murmur; no rub. Abdomen: Soft, non-tender, non-distended, BS + x 4.  Extremities: No clubbing, cyanosis, no edema. Pain on palp. Left first MTP. Neuro: Alert and oriented X 3. Moves all extremities spontaneously. Psych: Normal affect.  LABS: CBC:  Recent Labs  04/12/14 0244 04/13/14 0350  WBC 7.2 7.4  HGB 7.5* 8.2*  HCT 22.6* 25.1*  MCV 90.0 90.6  PLT 219 242   Basic Metabolic Panel:  Recent  Labs  04/12/14 0244 04/13/14 0350  NA 137 138  K 3.9 4.2  CL 98 100  CO2 25 24  GLUCOSE 181* 166*  BUN 57* 53*  CREATININE 3.05* 2.78*  CALCIUM 8.3* 8.7   Liver Function Tests:No results found for this basename: AST, ALT, ALKPHOS, BILITOT, PROT, ALBUMIN,  in the last 72 hours Lab Results  Component Value Date   TROPONINI >20.00* 04/06/2014   BNP: Pro B Natriuretic peptide (BNP)  Date/Time Value Ref Range Status  04/10/2014  2:26 AM 5125.0* 0 - 125 pg/mL Final  04/08/2014  9:50 AM 4974.0* 0 - 125 pg/mL Final   TELE:   AV pacing, rare PVCs  Radiology/Studies: US Renal 04/10/2014   CLINICAL DATA:  Acute kidney injury.  Previous left nephrectomy.  EXAM: RENAL/URINARY TRACT ULTRASOUND COMPLETE  COMPARISON:  None.  FINDINGS: Right Kidney:  Length: 11.8 cm. Diffusely increased parenchymal echogenicity noted. No mass or hydronephrosis visualized.  Left Kidney:  Length: Surgically absent.  Bladder:  Empty with Foley catheter in place.  IMPRESSION: Previous left nephrectomy.  Increased parenchymal echogenicity of the right kidney, consistent with medical renal disease. No evidence of hydronephrosis.   Electronically Signed   By: Earle Gell M.D.   On: 04/10/2014 17:50    Current Medications:  .  allopurinol  100 mg Oral Daily  . amLODipine  7.5 mg Oral Daily  . antiseptic oral rinse  15 mL Mouth Rinse BID  . aspirin EC  81 mg Oral Daily  . atorvastatin  20 mg Oral q1800  . carvedilol  25 mg Oral BID WC  . cloNIDine  0.3 mg Oral TID  . colchicine  0.6 mg Oral BID  . insulin aspart  0-15 Units Subcutaneous TID WC  . insulin aspart  0-5 Units Subcutaneous QHS  . insulin NPH Human  40 Units Subcutaneous QAC breakfast  . insulin NPH Human  5 Units Subcutaneous QHS  . pantoprazole  40 mg Oral BID  . sodium chloride  3 mL Intravenous Q12H      ASSESSMENT AND PLAN:   NSTEMI (non-ST elevated myocardial infarction) - No cath due to poor renal function, on ASA, BB, statin, EF 50-55%. Off IV  nitrates.   Active Problems:   Hypertensive emergency - BP still elevated at times. Will increase amlodipine to 10 mg daily. Also on Coreg 25 mg bid, cloinidine 0.3 mg tid. Not a candidate for ACEi/ARB due to renal failure. If BP continues to spike will add hydralazine po.     Acute respiratory failure with hypoxia     Pulmonary edema - No longer on diuretics, appears euvolemic.    Malignant hypertension -   Acute on chronic renal failure. Creatinine continues to gradually improve.   Deconditioning: continue PT.   Hopefully home in a couple of days if she continues to progress.   DM on insulin. BS running high. Will add Novolin 5 u SQ tid with meals.    Signed,   Aanchal Cope [place], [place] 04/13/2014 8:02 AM

## 2014-04-13 NOTE — Progress Notes (Signed)
CARDIAC REHAB PHASE I   PRE:  Rate/Rhythm: 62 pacing  BP:  Supine: 164/83  Sitting:  Standing:    SaO2: 98%RA  MODE:  Ambulation: 60  ft Outside of door  POST:  Rate/Rhythm: 102 paced  BP:  Supine:   Sitting: 169/93  Standing:    SaO2: 96%RA 1500-1535 Pt walked 60 ft with rolling walker and asst x 2 with steady gait. Can be asst x 1. Able to put left foot down and bear weight. Stated less painful today. A lot better. To recliner after walk.   Graylon Good, RN BSN  04/13/2014 3:34 PM

## 2014-04-13 NOTE — Progress Notes (Signed)
Pt refused BiPAP. Stated that she wasn't able to wear it all last night, and that "hurt her gums". Pt is not currently on bi-level/CPAP at home, and comfortable on RA.

## 2014-04-13 NOTE — Progress Notes (Signed)
[facility] Progress Note    Assessment/ Plan:   1 Acute on CKD- prob due to relative hypotension in this pt with prolonged hx of significant HTN, but there are continued episodes of uncontrolled hypertension requiring uptitration temporarily with the nitroglycerin.  -- also checking for metanephrines, catecholamines and VMA --> pending (calling lab today) --> still pending (sent out already).  -- rbc's in the urine likely from the malignant hypertension which can cause MAHA as well as proteinuria + dysmorphic RBC's in the urine --> clearing on repeat u/a.  -- Continue amlodipine 5mg  daily; BP still episodic but not as big changes.  -- May need to  increase antihypertensives at this time; we have  allowed her to equilibrate to maintain perfusion.  -- no indication for dialysis at this time and her renal function has stabilized.  --  fortunately urine output is very good with a downward trend in the Cr.  -- just needs BMET qod given the good uop.  -- will start lasix 20mg  po twice daily; she needs to be on a diuretic to be considered resistant hypertension. BP still episodically high with no hypotensive episodes.  -- discontinue foley today  2 CKD stage IV- baseline creat 1.9- 2.1, due to DM/HTN, f/b Dr Hinda Lenis in [place]  3 Hx L nephrectomy '01 for RCC  4 Pulm edema.  5 Obesity    Subjective:   She denies chest pain or diaphoresis.  No rashes, photophobia or sinus problems.  She feels better today overall.    Objective:   BP 187/66  Pulse 72  Temp(Src) 98.1 F (36.7 C) (Oral)  Resp 18  Ht 5\' 4"  (1.626 m)  Wt 100.8 kg (222 lb 3.6 oz)  BMI 38.13 kg/m2  SpO2 96%  Intake/Output Summary (Last 24 hours) at 04/13/14 1020 Last data filed at 04/13/14 0700  Gross per 24 hour  Intake    660 ml  Output    900 ml  Net   -240 ml   Weight change: -3.3 kg (-7 lb 4.4 oz)  Physical Exam: Alert, obese pleasant AAF in no distress  No rash, cyanosis  Sclera  anicteric, throat clear  No thyromegaly, no bruits  Chest rales 1/3 up on R, L base mostly clear  RRR no MRG  Abd obese, soft, NTND, no ascites or edema  Trace lower edema  No ulcer or gangrene  Neuro is nf, Ox 3, no asterixis +foley   Imaging: No results found.  Labs: BMET  Recent Labs Lab 04/06/14 1225 04/08/14 0950 04/09/14 0227 04/10/14 0226 04/11/14 1144 04/12/14 0244 04/13/14 0350  NA 140 137 138 138 135* 137 138  K 3.6* 3.6* 3.5* 3.6* 4.3 3.9 4.2  CL 99 98 100 98 96 98 100  CO2 27 20 23 25 26 25 24   GLUCOSE 212* 246* 249* 240* 302* 181* 166*  BUN 32* 38* 43* 48* 55* 57* 53*  CREATININE 2.60* 3.06* 3.32* 3.51* 3.30* 3.05* 2.78*  CALCIUM 8.1* 8.1* 7.8* 8.1* 8.3* 8.3* 8.7   CBC  Recent Labs Lab 04/10/14 0226 04/11/14 0413 04/12/14 0244 04/13/14 0350  WBC 7.4 6.0 7.2 7.4  NEUTROABS 4.5  --   --   --   HGB 7.7* 8.1* 7.5* 8.2*  HCT 23.4* 24.6* 22.6* 25.1*  MCV 90.7 92.1 90.0 90.6  PLT 227 190 219 246    Medications:    . allopurinol  100 mg Oral Daily  . amLODipine  10 mg Oral Daily  .  antiseptic oral rinse  15 mL Mouth Rinse BID  . aspirin EC  81 mg Oral Daily  . atorvastatin  20 mg Oral q1800  . carvedilol  25 mg Oral BID WC  . cloNIDine  0.3 mg Oral TID  . colchicine  0.6 mg Oral BID  . insulin aspart  0-15 Units Subcutaneous TID WC  . insulin aspart  0-5 Units Subcutaneous QHS  . insulin aspart  5 Units Subcutaneous TID WC  . insulin NPH Human  40 Units Subcutaneous QAC breakfast  . insulin NPH Human  5 Units Subcutaneous QHS  . pantoprazole  40 mg Oral BID  . sodium chloride  3 mL Intravenous Q12H      Otelia Santee, MD 04/13/2014, 10:20 AM

## 2014-04-14 DIAGNOSIS — I10 Essential (primary) hypertension: Secondary | ICD-10-CM

## 2014-04-14 LAB — GLUCOSE, CAPILLARY
GLUCOSE-CAPILLARY: 179 mg/dL — AB (ref 70–99)
GLUCOSE-CAPILLARY: 175 mg/dL — AB (ref 70–99)
Glucose-Capillary: 124 mg/dL — ABNORMAL HIGH (ref 70–99)
Glucose-Capillary: 131 mg/dL — ABNORMAL HIGH (ref 70–99)

## 2014-04-14 LAB — BASIC METABOLIC PANEL
ANION GAP: 13 (ref 5–15)
BUN: 46 mg/dL — ABNORMAL HIGH (ref 6–23)
CO2: 25 meq/L (ref 19–32)
Calcium: 8.5 mg/dL (ref 8.4–10.5)
Chloride: 100 mEq/L (ref 96–112)
Creatinine, Ser: 2.45 mg/dL — ABNORMAL HIGH (ref 0.50–1.10)
GFR calc non Af Amer: 19 mL/min — ABNORMAL LOW (ref >90)
GFR, EST AFRICAN AMERICAN: 22 mL/min — AB (ref >90)
Glucose, Bld: 155 mg/dL — ABNORMAL HIGH (ref 70–99)
POTASSIUM: 3.5 meq/L — AB (ref 3.7–5.3)
Sodium: 138 mEq/L (ref 137–147)

## 2014-04-14 LAB — CBC
HCT: 23 % — ABNORMAL LOW (ref 36.0–46.0)
Hemoglobin: 7.6 g/dL — ABNORMAL LOW (ref 12.0–15.0)
MCH: 29.3 pg (ref 26.0–34.0)
MCHC: 33 g/dL (ref 30.0–36.0)
MCV: 88.8 fL (ref 78.0–100.0)
PLATELETS: 269 10*3/uL (ref 150–400)
RBC: 2.59 MIL/uL — AB (ref 3.87–5.11)
RDW: 13.5 % (ref 11.5–15.5)
WBC: 6.8 10*3/uL (ref 4.0–10.5)

## 2014-04-14 LAB — VMA + CREATININE, URINE (TIMED COLLECTION)
CREATININE 24H URINE: 1.08 g/(24.h) (ref 0.63–2.50)
Vanillylmandelic Acid, (VMA): 4.9 mg/24 h (ref <=6.0)
Volume, Urine-VMAUR: 800 mL

## 2014-04-14 NOTE — Progress Notes (Signed)
Alyssa [name] Progress Note    Assessment/ Plan:   1 Acute on CKD- prob due to relative hypotension in this pt with prolonged hx of significant HTN, but there are continued episodes of uncontrolled hypertension requiring uptitration temporarily with the nitroglycerin.  -- also checking for metanephrines, catecholamines and VMA --> pending (calling lab today) --> still pending (sent out already). Can always follow up as outpatient.   -- rbc's in the urine likely from the malignant hypertension which can cause MAHA as well as proteinuria + dysmorphic RBC's in the urine --> clearing on repeat u/a.  -- Continue amlodipine 5mg  daily; BP still episodic but not as big change.  -- May need to increase antihypertensives at this time; we have allowed her to equilibrate to maintain perfusion.  -- no indication for dialysis at this time and her renal function has stabilized     -- Alyssa [name] already has f/u with Dr. Erling Cruz in August 2015.  -- fortunately urine output is very good with a downward trend in the Cr.  -- just needs BMET qod given the good uop.  -- started on lasix 20mg  po twice daily (7/24); Alyssa [name] needs to be on a diuretic to be considered resistant hypertension. BP still episodically high with no hypotensive episodes.  -- discontinue foley today  2 CKD stage IV- baseline creat 1.9- 2.1, due to DM/HTN, f/b Dr Hinda Lenis in [place]  3 Hx L nephrectomy '01 for RCC  4 Pulm edema.  5 Obesity      Subjective:   Alyssa [name] denies chest pain or diaphoresis.  No rashes, photophobia or sinus problems.  Alyssa [name] feels better today overall. Has done with the foley out (7/24)     Objective:   BP 164/62  Pulse 66  Temp(Src) 98.4 F (36.9 C) (Oral)  Resp 18  Ht 5\' 4"  (1.626 m)  Wt 100.9 kg (222 lb 7.1 oz)  BMI 38.16 kg/m2  SpO2 99%  Intake/Output Summary (Last 24 hours) at 04/14/14 1128 Last data filed at 04/14/14 4536  Gross per 24 hour  Intake    240 ml  Output   1150 ml  Net    -910 ml   Weight change: 0.1 kg (3.5 oz)  Physical Exam: Alert, obese pleasant AAF in no distress  No rash, cyanosis  Sclera anicteric, throat clear  No thyromegaly, no bruits  Chest rales 1/3 up on R, L base mostly clear  RRR no MRG  Abd obese, soft, NTND, no ascites or edema  Trace lower edema  No ulcer or gangrene  Neuro is nf, Ox 3, no asterixis    Imaging: No results found.  Labs: BMET  Recent Labs Lab 04/08/14 0950 04/09/14 0227 04/10/14 0226 04/11/14 1144 04/12/14 0244 04/13/14 0350 04/14/14 0414  NA 137 138 138 135* 137 138 138  K 3.6* 3.5* 3.6* 4.3 3.9 4.2 3.5*  CL 98 100 98 96 98 100 100  CO2 20 23 25 26 25 24 25   GLUCOSE 246* 249* 240* 302* 181* 166* 155*  BUN 38* 43* 48* 55* 57* 53* 46*  CREATININE 3.06* 3.32* 3.51* 3.30* 3.05* 2.78* 2.45*  CALCIUM 8.1* 7.8* 8.1* 8.3* 8.3* 8.7 8.5   CBC  Recent Labs Lab 04/10/14 0226 04/11/14 0413 04/12/14 0244 04/13/14 0350 04/14/14 0414  WBC 7.4 6.0 7.2 7.4 6.8  NEUTROABS 4.5  --   --   --   --   HGB 7.7* 8.1* 7.5* 8.2* 7.6*  HCT 23.4* 24.6* 22.6* 25.1* 23.0*  MCV 90.7 92.1 90.0 90.6 88.8  PLT 227 190 219 246 269    Medications:    . allopurinol  100 mg Oral Daily  . amLODipine  10 mg Oral Daily  . antiseptic oral rinse  15 mL Mouth Rinse BID  . aspirin EC  81 mg Oral Daily  . atorvastatin  20 mg Oral q1800  . carvedilol  25 mg Oral BID WC  . cloNIDine  0.3 mg Oral TID  . colchicine  0.6 mg Oral BID  . furosemide  20 mg Oral BID  . insulin aspart  0-15 Units Subcutaneous TID WC  . insulin aspart  0-5 Units Subcutaneous QHS  . insulin aspart  5 Units Subcutaneous TID WC  . insulin NPH Human  40 Units Subcutaneous QAC breakfast  . insulin NPH Human  5 Units Subcutaneous QHS  . pantoprazole  40 mg Oral BID  . sodium chloride  3 mL Intravenous Q12H      Otelia Santee, MD 04/14/2014, 11:28 AM

## 2014-04-14 NOTE — Progress Notes (Signed)
CARDIAC REHAB PHASE I   PRE:  Rate/Rhythm: 71 sinus  BP:  Supine:   Sitting: 166/56  Standing:    SaO2: 100 RA  MODE:  Ambulation: 200 ft   POST:  Rate/Rhythem: 99 sinus  BP:  Supine:   Sitting: 211/71  Standing:    SaO2: 99 RA  Pt ambulated 200 ft with assist x1 using rolling walker.  Pt tolerated walk well, c/o chronic back pain, gait steady.  Took 1 standing rest break.  Pt returned to chair after walk with call bell in reach.  Reviewed MI booklet, diet, exercise, risk factors, medication use, and when to call 911/MD.  Pt voiced understanding.  Also reviewed Cardiac Rehab Phase II and pt wishes to have info sent to [facility].  Pt encouraged to continue to walk with staff over weekend. Alberteen Sam, MA, ACSM RCEP [phone]  Clotilde Dieter

## 2014-04-14 NOTE — Progress Notes (Signed)
Primary cardiologist: Dr. Satira Sark  Subjective:   Sitting in a bedside chair. Walked down to nursing station today with rehabilitation. Seems to be getting stronger.   Objective:   Temp:  [97.7 F (36.5 C)-98.4 F (36.9 C)] 98.4 F (36.9 C) (07/25 0636) Pulse Rate:  [62-69] 66 (07/25 0636) Resp:  [18-19] 18 (07/25 0636) BP: (130-171)/(47-76) 164/62 mmHg (07/25 0636) SpO2:  [97 %-100 %] 99 % (07/25 0636) Weight:  [222 lb 7.1 oz (100.9 kg)] 222 lb 7.1 oz (100.9 kg) (07/25 0405) Last BM Date: 04/13/14  Filed Weights   04/12/14 0457 04/13/14 0543 04/14/14 0405  Weight: 229 lb 8 oz (104.1 kg) 222 lb 3.6 oz (100.8 kg) 222 lb 7.1 oz (100.9 kg)    Intake/Output Summary (Last 24 hours) at 04/14/14 1049 Last data filed at 04/14/14 8099  Gross per 24 hour  Intake    243 ml  Output   1150 ml  Net   -907 ml    Telemetry: Sinus rhythm.  Exam:  General: Obese, no distress.  Lungs: Clear, nonlabored.  Cardiac: RRR, no gallop.  Extremities: Mild edema.   Lab Results:  Basic Metabolic Panel:  Recent Labs Lab 04/12/14 0244 04/13/14 0350 04/14/14 0414  NA 137 138 138  K 3.9 4.2 3.5*  CL 98 100 100  CO2 25 24 25   GLUCOSE 181* 166* 155*  BUN 57* 53* 46*  CREATININE 3.05* 2.78* 2.45*  CALCIUM 8.3* 8.7 8.5    CBC:  Recent Labs Lab 04/12/14 0244 04/13/14 0350 04/14/14 0414  WBC 7.2 7.4 6.8  HGB 7.5* 8.2* 7.6*  HCT 22.6* 25.1* 23.0*  MCV 90.0 90.6 88.8  PLT 219 246 269     Medications:   Scheduled Medications: . allopurinol  100 mg Oral Daily  . amLODipine  10 mg Oral Daily  . antiseptic oral rinse  15 mL Mouth Rinse BID  . aspirin EC  81 mg Oral Daily  . atorvastatin  20 mg Oral q1800  . carvedilol  25 mg Oral BID WC  . cloNIDine  0.3 mg Oral TID  . colchicine  0.6 mg Oral BID  . furosemide  20 mg Oral BID  . insulin aspart  0-15 Units Subcutaneous TID WC  . insulin aspart  0-5 Units Subcutaneous QHS  . insulin aspart  5 Units  Subcutaneous TID WC  . insulin NPH Human  40 Units Subcutaneous QAC breakfast  . insulin NPH Human  5 Units Subcutaneous QHS  . pantoprazole  40 mg Oral BID  . sodium chloride  3 mL Intravenous Q12H      PRN Medications:  sodium chloride, acetaminophen, alum & mag hydroxide-simeth, bisacodyl, hydrALAZINE, morphine injection, nitroGLYCERIN, ondansetron (ZOFRAN) IV, sodium chloride   Assessment:   1. NSTEMI in the setting of hypertensive urgency. Plan for medical therapy at this point particularly in light of renal insufficiency and risk of contrast nephropathy.  2. History of nonobstructive CAD at cardiac catheterization in 2007. LVEF 50-55% at this time with possible apical hypokinesis, elevated filling pressures.  3. Malignant hypertension with fluctuating blood pressures. Patient also being followed by Nephrology. Secondary workup with assessment of urine metanephrines and catecholamines in normal range.  4. Acute on chronic renal insufficiency with baseline stage IV disease. Creatinine trending down, currently 2.4. Nephrology following, Lasix was started yesterday. She has had good urine output.   Plan/Discussion:    Norvasc was increased to 10 mg daily in the last 24-hour and Lasix was  added. Reluctant to make further changes now until we see where blood pressure stabilizes. Hydralazine would be a reasonable next choice. Continue to ambulate, possible discharge in the next 24-48 hours.   Satira Sark, M.D., F.A.C.C.

## 2014-04-15 LAB — GLUCOSE, CAPILLARY
GLUCOSE-CAPILLARY: 75 mg/dL (ref 70–99)
GLUCOSE-CAPILLARY: 84 mg/dL (ref 70–99)
GLUCOSE-CAPILLARY: 141 mg/dL — AB (ref 70–99)
Glucose-Capillary: 111 mg/dL — ABNORMAL HIGH (ref 70–99)

## 2014-04-15 LAB — CBC
HEMATOCRIT: 23.4 % — AB (ref 36.0–46.0)
Hemoglobin: 7.7 g/dL — ABNORMAL LOW (ref 12.0–15.0)
MCH: 29.7 pg (ref 26.0–34.0)
MCHC: 32.9 g/dL (ref 30.0–36.0)
MCV: 90.3 fL (ref 78.0–100.0)
Platelets: 285 10*3/uL (ref 150–400)
RBC: 2.59 MIL/uL — AB (ref 3.87–5.11)
RDW: 13.7 % (ref 11.5–15.5)
WBC: 5.5 10*3/uL (ref 4.0–10.5)

## 2014-04-15 LAB — BASIC METABOLIC PANEL
Anion gap: 13 (ref 5–15)
BUN: 42 mg/dL — ABNORMAL HIGH (ref 6–23)
CHLORIDE: 102 meq/L (ref 96–112)
CO2: 24 mEq/L (ref 19–32)
Calcium: 8.6 mg/dL (ref 8.4–10.5)
Creatinine, Ser: 2.57 mg/dL — ABNORMAL HIGH (ref 0.50–1.10)
GFR calc Af Amer: 21 mL/min — ABNORMAL LOW (ref >90)
GFR calc non Af Amer: 18 mL/min — ABNORMAL LOW (ref >90)
GLUCOSE: 69 mg/dL — AB (ref 70–99)
POTASSIUM: 3.8 meq/L (ref 3.7–5.3)
Sodium: 139 mEq/L (ref 137–147)

## 2014-04-15 LAB — CATECHOLAMINES, FRACTIONATED, URINE, 24 HOUR
Catecholamines T: 17 mcg/24 h — ABNORMAL LOW (ref 26–121)
Creatinine, Urine mg/day-CATEUR: 1.16 g/(24.h) (ref 0.63–2.50)
Dopamine 24 Hr Urine: 69 mcg/24 h (ref 52–480)
Epinephrine 24 Hr Urine: 3 mcg/24 h (ref 2–24)
Norepinephrine 24 Hr Urine: 14 mcg/24 h — ABNORMAL LOW (ref 15–100)
TOTAL URINE VOLUME: 800 mL

## 2014-04-15 NOTE — Progress Notes (Signed)
Patient had episode of vomiting this morning.  States that she feels better, but requested medicine for nausea.  Administered Zofran.  Will continue to monitor.

## 2014-04-15 NOTE — Progress Notes (Signed)
[facility] Progress Note    Assessment/ Plan:   1 Acute on CKD- prob due to relative hypotension in this pt with prolonged hx of significant HTN, but there are continued episodes of uncontrolled hypertension requiring uptitration temporarily with the nitroglycerin.  -- also checking for metanephrines, catecholamines and VMA --> pending (calling lab today) --> still pending (sent out already). Can always follow up as outpatient.  -- rbc's in the urine likely from the malignant hypertension which can cause MAHA as well as proteinuria + dysmorphic RBC's in the urine --> clearing on repeat u/a.  -- Continue amlodipine 5mg  daily; BP still episodic but not as big change.  -- May need to increase antihypertensives at this time; we have allowed her to equilibrate to maintain perfusion.  -- no indication for dialysis at this time and her renal function has stabilized  -- she already has f/u with Dr. Erling Cruz in August 2015.  -- fortunately urine output is very good with a downward trend in the Cr.  -- just needs BMET qod given the good uop.  -- started on lasix 20mg  po twice daily (7/24); she needs to be on a diuretic to be considered resistant hypertension. BP still episodically high with no hypotensive episodes.  -- discontinue foley today on 7/24 --> will check post void -- renal function stable and not far off baseline.  2 CKD stage IV- baseline creat 1.9- 2.1, due to DM/HTN  3 Hx L nephrectomy '01 for RCC  4 Pulm edema.  5 Obesity      Subjective:   She denies chest pain or diaphoresis but had nausea and vomiting this AM. Currently not nauseous.  No rashes, photophobia or sinus problems.     Objective:   BP 150/64  Pulse 62  Temp(Src) 98 F (36.7 C) (Oral)  Resp 16  Ht 5\' 4"  (1.626 m)  Wt 101.2 kg (223 lb 1.7 oz)  BMI 38.28 kg/m2  SpO2 98%  Intake/Output Summary (Last 24 hours) at 04/15/14 1204 Last data filed at 04/15/14 0700  Gross per 24 hour  Intake     840 ml  Output   1350 ml  Net   -510 ml   Weight change: 0.3 kg (10.6 oz)  Physical Exam: Alert, obese pleasant AAF in no distress  No rash, cyanosis  Sclera anicteric, throat clear  No thyromegaly, no bruits  Chest rales 1/3 up on R, L base mostly clear  RRR no MRG  Abd obese, soft, NTND, no ascites or edema  Trace lower edema  No ulcer or gangrene  Neuro is nf, Ox 3, no asterixis    Imaging: No results found.  Labs: BMET  Recent Labs Lab 04/09/14 0227 04/10/14 0226 04/11/14 1144 04/12/14 0244 04/13/14 0350 04/14/14 0414 04/15/14 0558  NA 138 138 135* 137 138 138 139  K 3.5* 3.6* 4.3 3.9 4.2 3.5* 3.8  CL 100 98 96 98 100 100 102  CO2 23 25 26 25 24 25 24   GLUCOSE 249* 240* 302* 181* 166* 155* 69*  BUN 43* 48* 55* 57* 53* 46* 42*  CREATININE 3.32* 3.51* 3.30* 3.05* 2.78* 2.45* 2.57*  CALCIUM 7.8* 8.1* 8.3* 8.3* 8.7 8.5 8.6   CBC  Recent Labs Lab 04/10/14 0226  04/12/14 0244 04/13/14 0350 04/14/14 0414 04/15/14 0558  WBC 7.4  < > 7.2 7.4 6.8 5.5  NEUTROABS 4.5  --   --   --   --   --   HGB 7.7*  < >  7.5* 8.2* 7.6* 7.7*  HCT 23.4*  < > 22.6* 25.1* 23.0* 23.4*  MCV 90.7  < > 90.0 90.6 88.8 90.3  PLT 227  < > 219 246 269 285  < > = values in this interval not displayed.  Medications:    . allopurinol  100 mg Oral Daily  . amLODipine  10 mg Oral Daily  . antiseptic oral rinse  15 mL Mouth Rinse BID  . aspirin EC  81 mg Oral Daily  . atorvastatin  20 mg Oral q1800  . carvedilol  25 mg Oral BID WC  . cloNIDine  0.3 mg Oral TID  . colchicine  0.6 mg Oral BID  . furosemide  20 mg Oral BID  . insulin aspart  0-15 Units Subcutaneous TID WC  . insulin aspart  0-5 Units Subcutaneous QHS  . insulin aspart  5 Units Subcutaneous TID WC  . insulin NPH Human  40 Units Subcutaneous QAC breakfast  . insulin NPH Human  5 Units Subcutaneous QHS  . pantoprazole  40 mg Oral BID  . sodium chloride  3 mL Intravenous Q12H      Otelia Santee, MD 04/15/2014, 12:04 PM

## 2014-04-15 NOTE — Progress Notes (Signed)
Primary cardiologist: Dr. Satira Sark  Subjective:   Had an episode of nausea and emesis this morning, no chest pain or breathlessness.   Objective:   Temp:  [98 F (36.7 C)-98.3 F (36.8 C)] 98 F (36.7 C) (07/26 0436) Pulse Rate:  [61-77] 62 (07/26 0436) Resp:  [16-18] 16 (07/26 0436) BP: (150-153)/(60-70) 150/64 mmHg (07/26 0436) SpO2:  [97 %-99 %] 98 % (07/26 0436) Weight:  [223 lb 1.7 oz (101.2 kg)] 223 lb 1.7 oz (101.2 kg) (07/26 0436) Last BM Date: 04/13/14  Filed Weights   04/13/14 0543 04/14/14 0405 04/15/14 0436  Weight: 222 lb 3.6 oz (100.8 kg) 222 lb 7.1 oz (100.9 kg) 223 lb 1.7 oz (101.2 kg)    Intake/Output Summary (Last 24 hours) at 04/15/14 0940 Last data filed at 04/15/14 0700  Gross per 24 hour  Intake    840 ml  Output   1350 ml  Net   -510 ml    Telemetry: Sinus rhythm.  Exam:  General: Obese, no distress.  Lungs: Clear, nonlabored.  Cardiac: RRR, no gallop.  Extremities: Mild edema.   Lab Results:  Basic Metabolic Panel:  Recent Labs Lab 04/13/14 0350 04/14/14 0414 04/15/14 0558  NA 138 138 139  K 4.2 3.5* 3.8  CL 100 100 102  CO2 24 25 24   GLUCOSE 166* 155* 69*  BUN 53* 46* 42*  CREATININE 2.78* 2.45* 2.57*  CALCIUM 8.7 8.5 8.6    CBC:  Recent Labs Lab 04/13/14 0350 04/14/14 0414 04/15/14 0558  WBC 7.4 6.8 5.5  HGB 8.2* 7.6* 7.7*  HCT 25.1* 23.0* 23.4*  MCV 90.6 88.8 90.3  PLT 246 269 285     Medications:   Scheduled Medications: . allopurinol  100 mg Oral Daily  . amLODipine  10 mg Oral Daily  . antiseptic oral rinse  15 mL Mouth Rinse BID  . aspirin EC  81 mg Oral Daily  . atorvastatin  20 mg Oral q1800  . carvedilol  25 mg Oral BID WC  . cloNIDine  0.3 mg Oral TID  . colchicine  0.6 mg Oral BID  . furosemide  20 mg Oral BID  . insulin aspart  0-15 Units Subcutaneous TID WC  . insulin aspart  0-5 Units Subcutaneous QHS  . insulin aspart  5 Units Subcutaneous TID WC  . insulin NPH Human  40  Units Subcutaneous QAC breakfast  . insulin NPH Human  5 Units Subcutaneous QHS  . pantoprazole  40 mg Oral BID  . sodium chloride  3 mL Intravenous Q12H     PRN Medications: sodium chloride, acetaminophen, alum & mag hydroxide-simeth, bisacodyl, hydrALAZINE, morphine injection, nitroGLYCERIN, ondansetron (ZOFRAN) IV, sodium chloride   Assessment:   1. NSTEMI in the setting of hypertensive urgency. Plan for medical therapy at this point particularly in light of renal insufficiency and risk of contrast nephropathy.  2. History of nonobstructive CAD at cardiac catheterization in 2007. LVEF 50-55% at this time with possible apical hypokinesis, elevated filling pressures.  3. Malignant hypertension with fluctuating blood pressures. Patient also being followed by Nephrology. Secondary workup with assessment of urine metanephrines and catecholamines in normal range.  4. Acute on chronic renal insufficiency with baseline stage IV disease. Creatinine trending down, currently 2.4. Nephrology following, Lasix was started yesterday. She has had good urine output.  5. Anemia, hemoglobin relatively stable in review of recent lab work. No obvious bleeding. Question chronic with renal disease, although cannot locate old lab work  at this time.   Plan/Discussion:    Guaiac stools. Check anemia profile. Even if no active bleeding source noted here, she will probably require an outpatient GI workup, particularly if iron deficient. Blood pressure trend is better, no change to current regimen. Possibly home in the next 24-48 hours.   Satira Sark, M.D., F.A.C.C.

## 2014-04-16 ENCOUNTER — Encounter (HOSPITAL_COMMUNITY): Payer: Self-pay | Admitting: Interventional Cardiology

## 2014-04-16 DIAGNOSIS — D649 Anemia, unspecified: Secondary | ICD-10-CM | POA: Diagnosis present

## 2014-04-16 LAB — CBC
HEMATOCRIT: 24.2 % — AB (ref 36.0–46.0)
Hemoglobin: 7.9 g/dL — ABNORMAL LOW (ref 12.0–15.0)
MCH: 29.8 pg (ref 26.0–34.0)
MCHC: 32.6 g/dL (ref 30.0–36.0)
MCV: 91.3 fL (ref 78.0–100.0)
PLATELETS: 294 10*3/uL (ref 150–400)
RBC: 2.65 MIL/uL — ABNORMAL LOW (ref 3.87–5.11)
RDW: 13.9 % (ref 11.5–15.5)
WBC: 5.8 10*3/uL (ref 4.0–10.5)

## 2014-04-16 LAB — BASIC METABOLIC PANEL
ANION GAP: 12 (ref 5–15)
BUN: 43 mg/dL — AB (ref 6–23)
CHLORIDE: 103 meq/L (ref 96–112)
CO2: 25 mEq/L (ref 19–32)
Calcium: 8.5 mg/dL (ref 8.4–10.5)
Creatinine, Ser: 2.61 mg/dL — ABNORMAL HIGH (ref 0.50–1.10)
GFR, EST AFRICAN AMERICAN: 21 mL/min — AB (ref >90)
GFR, EST NON AFRICAN AMERICAN: 18 mL/min — AB (ref >90)
Glucose, Bld: 164 mg/dL — ABNORMAL HIGH (ref 70–99)
Potassium: 3.7 mEq/L (ref 3.7–5.3)
Sodium: 140 mEq/L (ref 137–147)

## 2014-04-16 LAB — RETICULOCYTES
RBC.: 2.65 MIL/uL — AB (ref 3.87–5.11)
RETIC COUNT ABSOLUTE: 98.1 10*3/uL (ref 19.0–186.0)
Retic Ct Pct: 3.7 % — ABNORMAL HIGH (ref 0.4–3.1)

## 2014-04-16 LAB — OCCULT BLOOD X 1 CARD TO LAB, STOOL: Fecal Occult Bld: NEGATIVE

## 2014-04-16 LAB — GLUCOSE, CAPILLARY
GLUCOSE-CAPILLARY: 66 mg/dL — AB (ref 70–99)
GLUCOSE-CAPILLARY: 97 mg/dL (ref 70–99)
Glucose-Capillary: 145 mg/dL — ABNORMAL HIGH (ref 70–99)
Glucose-Capillary: 145 mg/dL — ABNORMAL HIGH (ref 70–99)
Glucose-Capillary: 68 mg/dL — ABNORMAL LOW (ref 70–99)
Glucose-Capillary: 68 mg/dL — ABNORMAL LOW (ref 70–99)
Glucose-Capillary: 72 mg/dL (ref 70–99)
Glucose-Capillary: 121 mg/dL — ABNORMAL HIGH (ref 70–99)

## 2014-04-16 LAB — FOLATE: Folate: 11.9 ng/mL

## 2014-04-16 LAB — VITAMIN B12: Vitamin B-12: 479 pg/mL (ref 211–911)

## 2014-04-16 LAB — FERRITIN: Ferritin: 688 ng/mL — ABNORMAL HIGH (ref 10–291)

## 2014-04-16 MED ORDER — PANTOPRAZOLE SODIUM 40 MG PO TBEC
40.0000 mg | DELAYED_RELEASE_TABLET | Freq: Every day | ORAL | Status: DC
  Administered 2014-04-17: 40 mg via ORAL
  Filled 2014-04-16: qty 1

## 2014-04-16 NOTE — Progress Notes (Signed)
SATURATION QUALIFICATIONS: (This note is used to comply with regulatory documentation for home oxygen)  Patient Saturations on Room Air at Rest = 96%  Patient Saturations on Room Air while Ambulating = 86%  Patient Saturations on 2 Liters of oxygen while Ambulating = 93%  Please briefly explain why patient needs home oxygen:Pt desats on RA with activity.  May need O2 with ambulation.  Thanks  [facility] [phone] (pager)

## 2014-04-16 NOTE — Progress Notes (Signed)
Physical Therapy Treatment Patient Details Name: Alyssa [name] MRN: [id number] DOB: 03/14/1946 Today's Date: 04/16/2014    History of Present Illness Pt admit with HTN emergency.  Pt also with gout.      PT Comments    Pt admitted with above. Pt currently with functional limitations due to balance and endurance deficits.  Pt will benefit from skilled PT to increase their independence and safety with mobility to allow discharge to the venue listed below.   Follow Up Recommendations  Home health PT;Supervision/Assistance - 24 hour     Equipment Recommendations  None recommended by PT    Recommendations for Other Services       Precautions / Restrictions Precautions Precautions: Fall Restrictions Weight Bearing Restrictions: No    Mobility  Bed Mobility                  Transfers Overall transfer level: Needs assistance Equipment used: Rolling walker (2 wheeled) Transfers: Sit to/from Stand Sit to Stand: Min guard            Ambulation/Gait Ambulation/Gait assistance: Min guard Ambulation Distance (Feet): 150 Feet Assistive device: Rolling walker (2 wheeled) Gait Pattern/deviations: Step-to pattern;Wide base of support;Antalgic;Trunk flexed   Gait velocity interpretation: Below normal speed for age/gender General Gait Details: Pt needed cues to stand tall and stay close to RW.  OVerall RW safety was good.     Stairs            Wheelchair Mobility    Modified Rankin (Stroke Patients Only)       Balance Overall balance assessment: Needs assistance;History of Falls         Standing balance support: Bilateral upper extremity supported;During functional activity Standing balance-Leahy Scale: Fair Standing balance comment: can stand without UE support for a few minutes.                    Cognition Arousal/Alertness: Awake/alert Behavior During Therapy: WFL for tasks assessed/performed Overall Cognitive Status: Within Functional  Limits for tasks assessed                      Exercises General Exercises - Lower Extremity Ankle Circles/Pumps: AROM;Both;10 reps;Seated Long Arc Quad: AROM;Both;10 reps;Seated Hip Flexion/Marching: AROM;Both;10 reps;Seated    General Comments        Pertinent Vitals/Pain Pt desat to 86% on RA with activity.  Sat 98% when at rest on RA.  HR 65 bpm at rest and wnet to 114 bpm on monitor but unsure if artifact.  Nursing aware.  No pain c/o.      Home Living                      Prior Function            PT Goals (current goals can now be found in the care plan section) Progress towards PT goals: Progressing toward goals    Frequency  Min 3X/week    PT Plan Current plan remains appropriate    Co-evaluation             End of Session Equipment Utilized During Treatment: Gait belt;Oxygen Activity Tolerance: Patient limited by fatigue Patient left: in chair;with call bell/phone within reach     Time: 0388-8280 PT Time Calculation (min): 25 min  Charges:  $Gait Training: 8-22 mins $Therapeutic Exercise: 8-22 mins  G Codes:      INGOLD,Earmon Sherrow 04/16/2014, 1:57 PM [facility] [phone] (pager)

## 2014-04-16 NOTE — Progress Notes (Signed)
Patient Name: Alyssa [name] Date of Encounter: 04/16/2014     Active Problems:   Hypertensive emergency   NSTEMI (non-ST elevated myocardial infarction)   Acute respiratory failure with hypoxia   Pulmonary edema   Malignant hypertension    SUBJECTIVE  Had an episode of SOB yesterday when walking from the bathroom. Placed on 02 and feeling better. No CP. Feels weak.   CURRENT MEDS . allopurinol  100 mg Oral Daily  . amLODipine  10 mg Oral Daily  . antiseptic oral rinse  15 mL Mouth Rinse BID  . aspirin EC  81 mg Oral Daily  . atorvastatin  20 mg Oral q1800  . carvedilol  25 mg Oral BID WC  . cloNIDine  0.3 mg Oral TID  . colchicine  0.6 mg Oral BID  . furosemide  20 mg Oral BID  . insulin aspart  0-15 Units Subcutaneous TID WC  . insulin aspart  0-5 Units Subcutaneous QHS  . insulin aspart  5 Units Subcutaneous TID WC  . insulin NPH Human  40 Units Subcutaneous QAC breakfast  . insulin NPH Human  5 Units Subcutaneous QHS  . pantoprazole  40 mg Oral BID  . sodium chloride  3 mL Intravenous Q12H    OBJECTIVE  Filed Vitals:   04/15/14 0436 04/15/14 1339 04/15/14 2003 04/16/14 0459  BP: 150/64 168/60 145/55 156/61  Pulse: 62 65 63 62  Temp: 98 F (36.7 C) 97.6 F (36.4 C) 98.3 F (36.8 C) 97.7 F (36.5 C)  TempSrc: Oral Oral Oral Oral  Resp: 16 19 18 18   Height:      Weight: 223 lb 1.7 oz (101.2 kg)   222 lb 3.6 oz (100.8 kg)  SpO2: 98% 100% 100% 100%    Intake/Output Summary (Last 24 hours) at 04/16/14 0840 Last data filed at 04/16/14 0600  Gross per 24 hour  Intake    240 ml  Output    401 ml  Net   -161 ml   Filed Weights   04/14/14 0405 04/15/14 0436 04/16/14 0459  Weight: 222 lb 7.1 oz (100.9 kg) 223 lb 1.7 oz (101.2 kg) 222 lb 3.6 oz (100.8 kg)    PHYSICAL EXAM  General: Pleasant, NAD. Neuro: Alert and oriented X 3. Moves all extremities spontaneously. Psych: Normal affect. HEENT:  Normal  Neck: Supple without bruits or JVD. Lungs:  Resp  regular and unlabored, CTA. Heart: RRR no s3, s4, or murmurs. Abdomen: Soft, non-tender, non-distended, BS + x 4.  Extremities: No clubbing, cyanosis or edema. DP/PT/Radials 2+ and equal bilaterally.  Accessory Clinical Findings  CBC  Recent Labs  04/15/14 0558 04/16/14 0515  WBC 5.5 5.8  HGB 7.7* 7.9*  HCT 23.4* 24.2*  MCV 90.3 91.3  PLT 285 330   Basic Metabolic Panel  Recent Labs  04/15/14 0558 04/16/14 0515  NA 139 140  K 3.8 3.7  CL 102 103  CO2 24 25  GLUCOSE 69* 164*  BUN 42* 43*  CREATININE 2.57* 2.61*  CALCIUM 8.6 8.5    TELE  AV PAced  Radiology/Studies  US Renal  04/10/2014   CLINICAL DATA:  Acute kidney injury.  Previous left nephrectomy.  EXAM: RENAL/URINARY TRACT ULTRASOUND COMPLETE  COMPARISON:  None.  FINDINGS: Right Kidney:  Length: 11.8 cm. Diffusely increased parenchymal echogenicity noted. No mass or hydronephrosis visualized.  Left Kidney:  Length: Surgically absent.  Bladder:  Empty with Foley catheter in place.  IMPRESSION: Previous left nephrectomy.  Increased parenchymal  echogenicity of the right kidney, consistent with medical renal disease. No evidence of hydronephrosis.   Electronically Signed   By: Earle Gell M.D.   On: 04/10/2014 17:50   Dg Chest Port 1 View  04/10/2014   CLINICAL DATA:  Shortness of breath.  EXAM: PORTABLE CHEST - 1 VIEW  COMPARISON:  04/09/2014.  FINDINGS: The heart is enlarged but stable. Persistent pulmonary edema, unchanged. Small pleural effusions and bibasilar atelectasis persist.  IMPRESSION: Persistent CHF.   Electronically Signed   By: Kalman Jewels M.D.   On: 04/10/2014 07:24   Dg Chest Port 1 View  04/09/2014   CLINICAL DATA:  Pulmonary edema  EXAM: PORTABLE CHEST - 1 VIEW  COMPARISON:  Portable chest x-ray of April 07, 2014  FINDINGS: The lung volumes remain low. The interstitial markings remain mildly increased but are less prominent. The cardiac silhouette remains enlarged. Its margins are better defined.  The central pulmonary vascularity remains engorged but is also improved. The permanent pacemaker is unchanged in position. The bony thorax exhibits no acute change.  IMPRESSION: There has been mild interval decrease in the bilateral pulmonary interstitial edema consistent with improving CHF.   Electronically Signed   By: David  Martinique   On: 04/09/2014 07:54   Dg Chest Portable 1 View  04/05/2014   CLINICAL DATA:  Chest pain.  Short of breath.  EXAM: PORTABLE CHEST - 1 VIEW  COMPARISON:  04/29/2012  FINDINGS: Previous spinal fusion. Dual lead pacemaker with leads in the region of the right atrium and right ventricle. Heart at the upper limits of normal in size. Venous hypertension with interstitial pulmonary edema. No effusions.  IMPRESSION: Interstitial pulmonary edema.   Electronically Signed   By: Nelson Chimes M.D.   On: 04/05/2014 08:21   Dg Chest Port 1v Same Day  04/07/2014   CLINICAL DATA:  Shortness of breath.  Hypoxia.  EXAM: PORTABLE CHEST - 1 VIEW SAME DAY  COMPARISON:  04/05/2014  FINDINGS: Low lung volumes are again demonstrated. Heart size remains stable. Mild interstitial edema pattern again noted. There is mild worsening of atelectasis or infiltrate in the lung bases bilaterally. No definite pleural effusion seen. Dual lead transvenous pacemaker posterior spinal fixation rods remain in place.  IMPRESSION: Stable low lung volumes and probable diffuse pulmonary interstitial edema. Mild worsening of atelectasis or infiltrates in both lung bases.   Electronically Signed   By: Earle Gell M.D.   On: 04/07/2014 12:58    ASSESSMENT AND PLAN  Ms. Strack is a 68 y.o.female history of SSS with MDT PPM, mild non-obstructive CAD by cath 2007, DM2, HTN, CKD IV, gout, hyperlipidemia, renal cell CA with prior left nephrectomy admitted 7/17 with hypertensive emergency/NSTEMI   NSTEMI in the setting of hypertensive urgency. Troponin >20.  -- Plan for medical therapy at this point particularly in light of  renal insufficiency and risk of contrast nephropathy.  -- History of nonobstructive CAD at cardiac catheterization in 2007. LVEF 50-55% at this time with possible apical hypokinesis, elevated filling pressures.   Malignant hypertension with fluctuating blood pressures.  -- Patient also being followed by Nephrology. Secondary workup with assessment of urine metanephrines and catecholamines in normal range.  -- Continue clonidine 0.3mg  TID, coreg 25mg  BID, amlodipine 10mg  qd, Lasix 20mg  BID  Acute on chronic renal insufficiency - per nephrology: baseline creat 1.9- 2.1, due to DM/HTN. Creat has been trending downwards 3.51--> 2.45 ( 7/24 ). Lasix started on 7/24 and now creat trending upwards slightly to  2.61 today.  -- She already has f/u with Dr. Erling Cruz in August 2015.  -- Hx L nephrectomy '01 for RCC   Anemia, hemoglobin relatively stable in review of recent lab work. No obvious bleeding. Question chronic with renal disease, although cannot locate old lab work at this time. -- Guaiac stool negative. -- Ferratin 320. Sat ratio: 14. TIBC 184; Iron 25. -- Start iron supplementation. May need to see GI as an outpatient per Dr. Domenic Polite.   HLD- TC 239; TG 234; HDL 32; LDL 160. Continue statin.   Gout - continue colchicine   Deconditioning: continue PT.    Tyrell Antonio PA-C  Pager [phone]  I have examined the patient and reviewed assessment and plan and discussed with patient.  Agree with above as stated.  Patietn with exertional SHOB.  Diuresis limited by increasing Cr.  Worsening anemia over the course of the hospital stay.  Will get Gi evaluation for significant anemia.  THis may be contributing to DOE.  Johnny Gorter S.

## 2014-04-16 NOTE — Progress Notes (Signed)
[facility] ROUNDING NOTE   Subjective:   Interval History:  Feels much better this morning  Objective:  Vital signs in last 24 hours:  Temp:  [97.6 F (36.4 C)-98.3 F (36.8 C)] 97.7 F (36.5 C) (07/27 0459) Pulse Rate:  [62-65] 62 (07/27 0459) Resp:  [18-19] 18 (07/27 0459) BP: (145-168)/(55-61) 156/61 mmHg (07/27 0459) SpO2:  [100 %] 100 % (07/27 0459) Weight:  [100.8 kg (222 lb 3.6 oz)] 100.8 kg (222 lb 3.6 oz) (07/27 0459)  Weight change: -0.4 kg (-14.1 oz) Filed Weights   04/14/14 0405 04/15/14 0436 04/16/14 0459  Weight: 100.9 kg (222 lb 7.1 oz) 101.2 kg (223 lb 1.7 oz) 100.8 kg (222 lb 3.6 oz)    Intake/Output: I/O last 3 completed shifts: In: 600 [P.O.:600] Out: 1401 [Urine:1400; Stool:1]   Intake/Output this shift:  Total I/O In: 240 [P.O.:240] Out: 2 [Stool:2]  Alert, obese pleasant AAF in no distress  Chest diminished on left RRR no MRG  Abd obese, soft, NTND, no ascites or edema  No edema No ulcer or gangrene  Neuro is nf, Ox 3, no asterixis     Basic Metabolic Panel:  Recent Labs Lab 04/12/14 0244 04/13/14 0350 04/14/14 0414 04/15/14 0558 04/16/14 0515  NA 137 138 138 139 140  K 3.9 4.2 3.5* 3.8 3.7  CL 98 100 100 102 103  CO2 25 24 25 24 25   GLUCOSE 181* 166* 155* 69* 164*  BUN 57* 53* 46* 42* 43*  CREATININE 3.05* 2.78* 2.45* 2.57* 2.61*  CALCIUM 8.3* 8.7 8.5 8.6 8.5    Liver Function Tests:  Recent Labs Lab 04/10/14 0226  AST 14  ALT 12  ALKPHOS 69  BILITOT 0.4  PROT 5.9*  ALBUMIN 2.1*   No results found for this basename: LIPASE, AMYLASE,  in the last 168 hours No results found for this basename: AMMONIA,  in the last 168 hours  CBC:  Recent Labs Lab 04/10/14 0226  04/12/14 0244 04/13/14 0350 04/14/14 0414 04/15/14 0558 04/16/14 0515  WBC 7.4  < > 7.2 7.4 6.8 5.5 5.8  NEUTROABS 4.5  --   --   --   --   --   --   HGB 7.7*  < > 7.5* 8.2* 7.6* 7.7* 7.9*  HCT 23.4*  < > 22.6* 25.1* 23.0* 23.4*  24.2*  MCV 90.7  < > 90.0 90.6 88.8 90.3 91.3  PLT 227  < > 219 246 269 285 294  < > = values in this interval not displayed.  Cardiac Enzymes: No results found for this basename: CKTOTAL, CKMB, CKMBINDEX, TROPONINI,  in the last 168 hours  BNP: No components found with this basename: POCBNP,   CBG:  Recent Labs Lab 04/15/14 0556 04/15/14 1106 04/15/14 1620 04/15/14 2120 04/16/14 0629  GLUCAP 75 84 111* 141* 145*    Microbiology: No results found for this or any previous visit.  Coagulation Studies: No results found for this basename: LABPROT, INR,  in the last 72 hours  Urinalysis: No results found for this basename: COLORURINE, APPERANCEUR, LABSPEC, PHURINE, GLUCOSEU, HGBUR, BILIRUBINUR, KETONESUR, PROTEINUR, UROBILINOGEN, NITRITE, LEUKOCYTESUR,  in the last 72 hours    Imaging: No results found.   Medications:     . allopurinol  100 mg Oral Daily  . amLODipine  10 mg Oral Daily  . antiseptic oral rinse  15 mL Mouth Rinse BID  . aspirin EC  81 mg Oral Daily  . atorvastatin  20 mg Oral q1800  .  carvedilol  25 mg Oral BID WC  . cloNIDine  0.3 mg Oral TID  . colchicine  0.6 mg Oral BID  . furosemide  20 mg Oral BID  . insulin aspart  0-15 Units Subcutaneous TID WC  . insulin aspart  0-5 Units Subcutaneous QHS  . insulin aspart  5 Units Subcutaneous TID WC  . insulin NPH Human  40 Units Subcutaneous QAC breakfast  . insulin NPH Human  5 Units Subcutaneous QHS  . pantoprazole  40 mg Oral BID  . sodium chloride  3 mL Intravenous Q12H   sodium chloride, acetaminophen, alum & mag hydroxide-simeth, bisacodyl, hydrALAZINE, morphine injection, nitroGLYCERIN, ondansetron (ZOFRAN) IV, sodium chloride  Assessment/ Plan:  1 Acute on CKD- appears to be due stabilizing most likely secondary to malignant HTN ( catecholamine screen is negative ) 2 CKD stage III /  IV- baseline creat 1.9- 2.1, due to DM/HTN, f/b Dr Hinda Lenis in [place]  3 Hx L nephrectomy '01 for RCC  4  Pulm edema.  5 Obesity   Will sign off for now as there appears little to add patient can follow up with Dr Florene Glen in August    LOS: 11 Khani Paino W @TODAY @9 :53 AM

## 2014-04-16 NOTE — Consult Note (Signed)
[facility] Consult: 12:31 PM 04/16/2014  LOS: 11 days    Referring Provider: Irish Lack Primary Care Physician:  Maggie Font, MD Primary Gastroenterologist:  Dr Barney Drain    Reason for Consultation:  Anemia but FOBT negative.    HPI: Alyssa [name] is a 68 y.o. female history of sick sinus syndrome with Medtronic pacemaker,mild non-obstructive CAD by cath 2007, DM2/IDDM, HTN, hyperlipidemia, renal cell CA with prior left nephrectomy.    Admitted with hypertensive emergency, chest pain, acute on chronic kidney failure, NonSTEMI treated with IV Heparin, pulmonary edema and hypoxic resp failure treated with bipap.   Hgb baseline of 12. 2 in 04/2013.  Hgb ~ 11 in first 24 hours of admission, dropped to 8.7 within 48 hours,   Then up to 10.6.  Then down to 7.5, up to 8.2 and down to 7.7 yesterday, 7.9 today.  Has received no blood transfusions.   Hx of colonoscopy findings of polyps outlined below. Gi hx of chronic constipation. Takes Omeprazole 20 mg BID.  No blood in stools, no melena.  BMs 1 to 2 times per week.  Occasional mid abdominal pain, not severe.  No nausea, no dysphagia. No NSAIDS.     ENDOSCOPIC STUDIES: 05/2009  Colonoscopy # 4 of 4 lifetime. . Prior to 2010 had adenomatous polyps on all 3.  Dr Oneida Alar. .  Tortuous colon, few sigmoid tics, internal hemorrhoids.  Plan was colonoscopy in 2015.   EGD before 2000 Showed ulcers.     Past Medical History  Diagnosis Date  . Sick sinus syndrome 2004    Medtronic PPM 10/2004 - Dr. Lovena Le  . Coronary atherosclerosis of native coronary artery     Nonobstructive; 30-40% LAD, Cx, and RCA in 2007; normal EF by echo in 2006; nl stress nuclear in 2008  . Chronic diastolic heart failure   . Type 2 diabetes mellitus   . Essential hypertension, benign     . Hyperlipidemia   . Renal cell carcinoma   . GERD (gastroesophageal reflux disease)   . Allergic rhinitis   . Low back pain     Scoliosis and degenerative disc disease with  . Osteoarthritis   . Gout   . Hx of colonic polyps   . CKD (chronic kidney disease) stage 2, GFR 60-89 ml/min   . Anemia     Past Surgical History  Procedure Laterality Date  . Cataract extraction    . Tonsillectomy    . Cholecystectomy      Approx. 1995  . Abdominal hysterectomy      1974  . Nephrectomy      Left nephrectomy- renal cell carcinoma  . Knee surgery    . Insert / replace / remove pacemaker      Medtronic Dual chamber pacemaker 11/11/2004  . Back surgery      Prior to Admission medications   Medication Sig Start Date End Date Taking? Authorizing Provider  allopurinol (ZYLOPRIM) 100 MG tablet Take 100 mg by mouth daily.     Yes Historical Provider, MD  cloNIDine (CATAPRES) 0.3  MG tablet Take 0.3 mg by mouth 3 (three) times daily.     Yes Historical Provider, MD  diltiazem (CARDIZEM) 120 MG tablet 120 mg daily.    Yes Historical Provider, MD  HUMALOG MIX 75/25 (75-25) 100 UNIT/ML SUSP Inject 15-55 Units into the skin 2 (two) times daily with a meal. 55 units in the am and 15 in evening depending on CBG reading 11/12/11  Yes Historical Provider, MD  metoprolol (LOPRESSOR) 100 MG tablet Take 100 mg by mouth 2 (two) times daily.  07/27/11  Yes Yehuda Savannah, MD  Olmesartan-Amlodipine-HCTZ [facility]) 40-5-25 MG TABS Take 1 tablet by mouth daily.     Yes Historical Provider, MD  omeprazole (PRILOSEC) 20 MG capsule Take 20 mg by mouth 2 (two) times daily before a meal.  01/15/12  Yes Historical Provider, MD  tiZANidine (ZANAFLEX) 4 MG tablet Take 4 mg by mouth daily as needed for muscle spasms.  07/21/13  Yes Historical Provider, MD    Scheduled Meds: . allopurinol  100 mg Oral Daily  . amLODipine  10 mg Oral Daily  . antiseptic oral rinse  15 mL Mouth Rinse BID  . aspirin EC  81 mg Oral  Daily  . atorvastatin  20 mg Oral q1800  . carvedilol  25 mg Oral BID WC  . cloNIDine  0.3 mg Oral TID  . colchicine  0.6 mg Oral BID  . furosemide  20 mg Oral BID  . insulin aspart  0-15 Units Subcutaneous TID WC  . insulin aspart  0-5 Units Subcutaneous QHS  . insulin aspart  5 Units Subcutaneous TID WC  . insulin NPH Human  40 Units Subcutaneous QAC breakfast  . insulin NPH Human  5 Units Subcutaneous QHS  . pantoprazole  40 mg Oral BID  . sodium chloride  3 mL Intravenous Q12H   Infusions:   PRN Meds: sodium chloride, acetaminophen, alum & mag hydroxide-simeth, bisacodyl, hydrALAZINE, morphine injection, nitroGLYCERIN, ondansetron (ZOFRAN) IV, sodium chloride   Allergies as of 04/05/2014 - Review Complete 04/05/2014  Allergen Reaction Noted  . Simvastatin  12/16/2009  . Verapamil  01/29/2010    Family History  Problem Relation Age of Onset  . Colon polyps Mother 54  . Coronary artery disease Mother   . Breast cancer Other   . Ovarian cancer Other   . Uterine cancer Other   . Heart attack Father     History   Social History  . Marital Status: Widowed    Spouse Name: N/A    Number of Children: 2  . Years of Education: N/A   Occupational History  . diabled    Social History Main Topics  . Smoking status: Never Smoker   . Smokeless tobacco: Never Used  . Alcohol Use: No  . Drug Use: No  . Sexual Activity: Not on file   Other Topics Concern  . Not on file   Social History Narrative  . Son lives with pt in [place].     REVIEW OF SYSTEMS: Constitutional:  Stable weight ENT:  No nose bleeds Pulm:  2011 sleep study negative for OSA.  bipap only used one night this admission CV:  No palpitations, no LE edema.  GU:  No hematuria, no frequency GI:  Per HPI Heme:  Per HPI   Transfusions:  Per HPI Neuro:  No headaches, no peripheral tingling or numbness.  Poor balance, uses cane and walker.  No falls.  No problems reading.  Derm:  No itching, no  rash or  sores.  Endocrine:  No sweats or chills.  No polyuria or dysuria Immunization:  Not queried.  Travel:  None beyond local counties in last few months.    PHYSICAL EXAM: Vital signs in last 24 hours: Filed Vitals:   04/16/14 0459  BP: 156/61  Pulse: 62  Temp: 97.7 F (36.5 C)  Resp: 18   Wt Readings from Last 3 Encounters:  04/16/14 100.8 kg (222 lb 3.6 oz)  04/16/14 100.8 kg (222 lb 3.6 oz)  09/19/13 98.884 kg (218 lb)    General: obese, pleasant, looks well Head:  No asymmetry or swelling  Eyes:  + pallor, + exophthalmos.  Ears:  Not HOH Nose:  No congestion Mouth:  Clear, moist, many missing teeth Neck:  No mass, no goiter Lungs:  Clear bil.  No cough or dyspnea Heart: RRR.  No MRG Abdomen:  Soft, ND, NT, obese.  No mass.  No HSM.  No bruits.   Rectal: deferred.  FOBT negative in lab 7/27 Musc/Skeltl: no joint swelling Extremities:  No pedal or LE edema.  Feet warm  Neurologic:  Oriented x 3.  Good historian.  Skin:  No rash, no sores Tattoos:  none Nodes:  No cevical adenopathy.   Psych:  Pleasant, relaxed, in good spirits.  Fluid speech.   Intake/Output from previous day: 07/26 0701 - 07/27 0700 In: 240 [P.O.:240] Out: 401 [Urine:400; Stool:1] Intake/Output this shift: Total I/O In: 240 [P.O.:240] Out: 2 [Stool:2]  LAB RESULTS:  Recent Labs  04/14/14 0414 04/15/14 0558 04/16/14 0515  WBC 6.8 5.5 5.8  HGB 7.6* 7.7* 7.9*  HCT 23.0* 23.4* 24.2*  PLT 269 285 294   BMET Lab Results  Component Value Date   NA 140 04/16/2014   NA 139 04/15/2014   NA 138 04/14/2014   K 3.7 04/16/2014   K 3.8 04/15/2014   K 3.5* 04/14/2014   CL 103 04/16/2014   CL 102 04/15/2014   CL 100 04/14/2014   CO2 25 04/16/2014   CO2 24 04/15/2014   CO2 25 04/14/2014   GLUCOSE 164* 04/16/2014   GLUCOSE 69* 04/15/2014   GLUCOSE 155* 04/14/2014   BUN 43* 04/16/2014   BUN 42* 04/15/2014   BUN 46* 04/14/2014   CREATININE 2.61* 04/16/2014   CREATININE 2.57* 04/15/2014   CREATININE 2.45*  04/14/2014   CALCIUM 8.5 04/16/2014   CALCIUM 8.6 04/15/2014   CALCIUM 8.5 04/14/2014   LFT No results found for this basename: PROT, ALBUMIN, AST, ALT, ALKPHOS, BILITOT, BILIDIR, IBILI,  in the last 72 hours PT/INR Lab Results  Component Value Date   INR 0.97 04/05/2014   Iron/TIBC/Ferritin/ %Sat    Component Value Date/Time   IRON 25* 04/07/2014 1545   TIBC 184* 04/07/2014 1545   FERRITIN 688* 04/16/2014 0515   IRONPCTSAT 14* 04/07/2014 1545        B12                        479                                                                  04/16/2014    IMPRESSION:   *  Anemia. Normocytic.  FOBT negative. Anemia studies suggest  anemia of chronic disease.  Has not required transfusion.   *  Hypertension emergency with non STEMI.   *  S/p nephrectomy. Has CKD of undetermined stage.  This may be contributing to anemia.   *  Hx colon adenoma  On 3 separate colonoscopies.  No polyps on the 05/2009 colonoscopy.    *  Chronic constipation. Longstanding.  *  IDDM      PLAN:     *  TSH, rule out hypo/hyperthyroid.   *  Endoscopic eval not clearly indicated. Dr Fuller Plan to advise as to suggested endoscopic eval, if any, needed.     Azucena Freed  04/16/2014, 12:31 PM Pager: [phone]      Attending physician's note   I have taken a history, examined the patient and reviewed the chart. I agree with the Advanced Practitioner's note, impression and recommendations.  Anemia with Hemocult negative stool. Anemia work up suggests anemia of chronic disease. Not clear that she needs endoscopic evaluation. Please refer her back to Dr. Barney Drain for an outpatient office visit for consideration of colonoscopy and possible EGD. No further GI evaluation needed at this time. GI signing off.    Ladene Artist, MD Marval Regal

## 2014-04-16 NOTE — Progress Notes (Signed)
CARDIAC REHAB PHASE I   PRE:  Rate/Rhythm: paced 65  BP:  Supine:   Sitting: 168/68  Standing:    SaO2: 97%RA  MODE:  Ambulation: 200 ft   POST:  Rate/Rhythm: 98  BP:  Supine:   Sitting: 203/60, 192/66 after resting  Standing:    SaO2: 98%RA whole walk. Did not desat 1440-1515 Pt walked 200 ft on RA with rolling walker and asst x 1. Stopped once to rest. Put oxygen on pt but did not turn on and monitored sats. Never dropped below 98%RA. BP elevated after walk. No CP. Tired by end of walk.   Graylon Good, RN BSN  04/16/2014 3:11 PM

## 2014-04-17 ENCOUNTER — Encounter (HOSPITAL_COMMUNITY): Payer: Self-pay | Admitting: Cardiology

## 2014-04-17 DIAGNOSIS — J810 Acute pulmonary edema: Secondary | ICD-10-CM

## 2014-04-17 DIAGNOSIS — N184 Chronic kidney disease, stage 4 (severe): Secondary | ICD-10-CM

## 2014-04-17 DIAGNOSIS — N179 Acute kidney failure, unspecified: Secondary | ICD-10-CM | POA: Diagnosis present

## 2014-04-17 DIAGNOSIS — Z905 Acquired absence of kidney: Secondary | ICD-10-CM

## 2014-04-17 DIAGNOSIS — I10 Essential (primary) hypertension: Secondary | ICD-10-CM

## 2014-04-17 HISTORY — DX: Chronic kidney disease, stage 4 (severe): N18.4

## 2014-04-17 HISTORY — DX: Acquired absence of kidney: Z90.5

## 2014-04-17 HISTORY — DX: Acute pulmonary edema: J81.0

## 2014-04-17 LAB — BASIC METABOLIC PANEL
Anion gap: 15 (ref 5–15)
BUN: 43 mg/dL — ABNORMAL HIGH (ref 6–23)
CALCIUM: 8.8 mg/dL (ref 8.4–10.5)
CHLORIDE: 103 meq/L (ref 96–112)
CO2: 23 meq/L (ref 19–32)
Creatinine, Ser: 2.66 mg/dL — ABNORMAL HIGH (ref 0.50–1.10)
GFR calc Af Amer: 20 mL/min — ABNORMAL LOW (ref >90)
GFR calc non Af Amer: 17 mL/min — ABNORMAL LOW (ref >90)
GLUCOSE: 133 mg/dL — AB (ref 70–99)
POTASSIUM: 4.1 meq/L (ref 3.7–5.3)
SODIUM: 141 meq/L (ref 137–147)

## 2014-04-17 LAB — CBC
HCT: 28.1 % — ABNORMAL LOW (ref 36.0–46.0)
HEMOGLOBIN: 9.1 g/dL — AB (ref 12.0–15.0)
MCH: 29.5 pg (ref 26.0–34.0)
MCHC: 32.4 g/dL (ref 30.0–36.0)
MCV: 91.2 fL (ref 78.0–100.0)
Platelets: 369 10*3/uL (ref 150–400)
RBC: 3.08 MIL/uL — AB (ref 3.87–5.11)
RDW: 13.8 % (ref 11.5–15.5)
WBC: 6.6 10*3/uL (ref 4.0–10.5)

## 2014-04-17 LAB — IRON AND TIBC
IRON: 35 ug/dL — AB (ref 42–135)
Saturation Ratios: 18 % — ABNORMAL LOW (ref 20–55)
TIBC: 200 ug/dL — ABNORMAL LOW (ref 250–470)
UIBC: 165 ug/dL (ref 125–400)

## 2014-04-17 LAB — GLUCOSE, CAPILLARY
GLUCOSE-CAPILLARY: 132 mg/dL — AB (ref 70–99)
GLUCOSE-CAPILLARY: 138 mg/dL — AB (ref 70–99)

## 2014-04-17 LAB — TSH: TSH: 2.71 u[IU]/mL (ref 0.350–4.500)

## 2014-04-17 MED ORDER — HYDRALAZINE HCL 10 MG PO TABS
10.0000 mg | ORAL_TABLET | Freq: Three times a day (TID) | ORAL | Status: DC
  Administered 2014-04-17: 10 mg via ORAL
  Filled 2014-04-17 (×3): qty 1

## 2014-04-17 MED ORDER — ATORVASTATIN CALCIUM 20 MG PO TABS: 20.0000 mg | ORAL_TABLET | Freq: Every day | ORAL | Status: AC

## 2014-04-17 MED ORDER — COLCHICINE 0.6 MG PO TABS: 0.6000 mg | ORAL_TABLET | Freq: Two times a day (BID) | ORAL | Status: AC

## 2014-04-17 MED ORDER — HYDRALAZINE HCL 10 MG PO TABS: 10.0000 mg | ORAL_TABLET | Freq: Three times a day (TID) | ORAL | Status: AC

## 2014-04-17 MED ORDER — ASPIRIN 81 MG PO TBEC: 81.0000 mg | DELAYED_RELEASE_TABLET | Freq: Every day | ORAL | Status: AC

## 2014-04-17 MED ORDER — NITROGLYCERIN 0.4 MG SL SUBL: 0.4000 mg | SUBLINGUAL_TABLET | SUBLINGUAL | Status: AC | PRN

## 2014-04-17 MED ORDER — FUROSEMIDE 20 MG PO TABS: 20.0000 mg | ORAL_TABLET | Freq: Two times a day (BID) | ORAL | Status: AC

## 2014-04-17 MED ORDER — ACETAMINOPHEN 325 MG PO TABS: 650.0000 mg | ORAL_TABLET | ORAL | Status: AC | PRN

## 2014-04-17 MED ORDER — CARVEDILOL 25 MG PO TABS: 25.0000 mg | ORAL_TABLET | Freq: Two times a day (BID) | ORAL | Status: AC

## 2014-04-17 MED ORDER — AMLODIPINE BESYLATE 10 MG PO TABS: 10.0000 mg | ORAL_TABLET | Freq: Every day | ORAL | Status: AC

## 2014-04-17 NOTE — Progress Notes (Signed)
Patient Name: Alyssa [name] Date of Encounter: 04/17/2014     Active Problems:   Hypertensive emergency   NSTEMI (non-ST elevated myocardial infarction)   Acute respiratory failure with hypoxia   Pulmonary edema   Malignant hypertension   Anemia    SUBJECTIVE   Off 02 and feeling better. No CP. Wants to go home. No SHOB.  Negative GI w/u for anemia.  CURRENT MEDS . allopurinol  100 mg Oral Daily  . amLODipine  10 mg Oral Daily  . antiseptic oral rinse  15 mL Mouth Rinse BID  . aspirin EC  81 mg Oral Daily  . atorvastatin  20 mg Oral q1800  . carvedilol  25 mg Oral BID WC  . cloNIDine  0.3 mg Oral TID  . colchicine  0.6 mg Oral BID  . furosemide  20 mg Oral BID  . hydrALAZINE  10 mg Oral 3 times per day  . insulin aspart  0-15 Units Subcutaneous TID WC  . insulin aspart  0-5 Units Subcutaneous QHS  . insulin aspart  5 Units Subcutaneous TID WC  . insulin NPH Human  40 Units Subcutaneous QAC breakfast  . insulin NPH Human  5 Units Subcutaneous QHS  . pantoprazole  40 mg Oral Daily  . sodium chloride  3 mL Intravenous Q12H    OBJECTIVE  Filed Vitals:   04/16/14 1406 04/16/14 2028 04/17/14 0519 04/17/14 0812  BP: 143/60 159/63 188/73 167/72  Pulse: 63 72 65 66  Temp: 97.9 F (36.6 C) 98 F (36.7 C) 97.9 F (36.6 C)   TempSrc: Oral Oral Oral   Resp: 18 18 18    Height:      Weight:   222 lb 0.1 oz (100.7 kg)   SpO2: 98% 98% 100% 98%    Intake/Output Summary (Last 24 hours) at 04/17/14 1002 Last data filed at 04/17/14 0943  Gross per 24 hour  Intake    600 ml  Output      0 ml  Net    600 ml   Filed Weights   04/15/14 0436 04/16/14 0459 04/17/14 0519  Weight: 223 lb 1.7 oz (101.2 kg) 222 lb 3.6 oz (100.8 kg) 222 lb 0.1 oz (100.7 kg)    PHYSICAL EXAM  General: Pleasant, NAD. Neuro: Alert and oriented X 3. Moves all extremities spontaneously. Psych: Normal affect. HEENT:  Normal  Neck: Supple without bruits or JVD. Lungs:  Resp regular and  unlabored, CTA. Heart: RRR no s3, s4, or murmurs. Abdomen: Soft, non-tender, non-distended, BS + x 4. Obese Extremities: No clubbing, cyanosis or edema. DP/PT/Radials 2+ and equal bilaterally.  Accessory Clinical Findings  CBC  Recent Labs  04/16/14 0515 04/17/14 0525  WBC 5.8 6.6  HGB 7.9* 9.1*  HCT 24.2* 28.1*  MCV 91.3 91.2  PLT 294 536   Basic Metabolic Panel  Recent Labs  04/16/14 0515 04/17/14 0525  NA 140 141  K 3.7 4.1  CL 103 103  CO2 25 23  GLUCOSE 164* 133*  BUN 43* 43*  CREATININE 2.61* 2.66*  CALCIUM 8.5 8.8    TELE  AV PAced  Radiology/Studies  US Renal  04/10/2014   CLINICAL DATA:  Acute kidney injury.  Previous left nephrectomy.  EXAM: RENAL/URINARY TRACT ULTRASOUND COMPLETE  COMPARISON:  None.  FINDINGS: Right Kidney:  Length: 11.8 cm. Diffusely increased parenchymal echogenicity noted. No mass or hydronephrosis visualized.  Left Kidney:  Length: Surgically absent.  Bladder:  Empty with Foley catheter in place.  IMPRESSION: Previous left nephrectomy.  Increased parenchymal echogenicity of the right kidney, consistent with medical renal disease. No evidence of hydronephrosis.   Electronically Signed   By: Earle Gell M.D.   On: 04/10/2014 17:50   Dg Chest Port 1 View  04/10/2014   CLINICAL DATA:  Shortness of breath.  EXAM: PORTABLE CHEST - 1 VIEW  COMPARISON:  04/09/2014.  FINDINGS: The heart is enlarged but stable. Persistent pulmonary edema, unchanged. Small pleural effusions and bibasilar atelectasis persist.  IMPRESSION: Persistent CHF.   Electronically Signed   By: Kalman Jewels M.D.   On: 04/10/2014 07:24   Dg Chest Port 1 View  04/09/2014   CLINICAL DATA:  Pulmonary edema  EXAM: PORTABLE CHEST - 1 VIEW  COMPARISON:  Portable chest x-ray of April 07, 2014  FINDINGS: The lung volumes remain low. The interstitial markings remain mildly increased but are less prominent. The cardiac silhouette remains enlarged. Its margins are better defined. The  central pulmonary vascularity remains engorged but is also improved. The permanent pacemaker is unchanged in position. The bony thorax exhibits no acute change.  IMPRESSION: There has been mild interval decrease in the bilateral pulmonary interstitial edema consistent with improving CHF.   Electronically Signed   By: David  Martinique   On: 04/09/2014 07:54   Dg Chest Portable 1 View  04/05/2014   CLINICAL DATA:  Chest pain.  Short of breath.  EXAM: PORTABLE CHEST - 1 VIEW  COMPARISON:  04/29/2012  FINDINGS: Previous spinal fusion. Dual lead pacemaker with leads in the region of the right atrium and right ventricle. Heart at the upper limits of normal in size. Venous hypertension with interstitial pulmonary edema. No effusions.  IMPRESSION: Interstitial pulmonary edema.   Electronically Signed   By: Nelson Chimes M.D.   On: 04/05/2014 08:21   Dg Chest Port 1v Same Day  04/07/2014   CLINICAL DATA:  Shortness of breath.  Hypoxia.  EXAM: PORTABLE CHEST - 1 VIEW SAME DAY  COMPARISON:  04/05/2014  FINDINGS: Low lung volumes are again demonstrated. Heart size remains stable. Mild interstitial edema pattern again noted. There is mild worsening of atelectasis or infiltrate in the lung bases bilaterally. No definite pleural effusion seen. Dual lead transvenous pacemaker posterior spinal fixation rods remain in place.  IMPRESSION: Stable low lung volumes and probable diffuse pulmonary interstitial edema. Mild worsening of atelectasis or infiltrates in both lung bases.   Electronically Signed   By: Earle Gell M.D.   On: 04/07/2014 12:58    ASSESSMENT AND PLAN  Ms. Piatt is a 68 y.o.female history of SSS with MDT PPM, mild non-obstructive CAD by cath 2007, DM2, HTN, CKD IV, gout, hyperlipidemia, renal cell CA with prior left nephrectomy admitted 7/17 with hypertensive emergency/NSTEMI   NSTEMI in the setting of hypertensive urgency. Troponin >20.  -- Plan for medical therapy at this point particularly in light of  renal insufficiency and risk of contrast nephropathy.  -- History of nonobstructive CAD at cardiac catheterization in 2007. LVEF 50-55% at this time with possible apical hypokinesis, elevated filling pressures.   Malignant hypertension with fluctuating blood pressures.  -- Patient also being followed by Nephrology. Secondary workup with assessment of urine metanephrines and catecholamines in normal range.  -- Continue clonidine 0.3mg  TID, coreg 25mg  BID, amlodipine 10mg  qd, Lasix 20mg  BID.  Now of of ARB due to renal insufficiency. Start hydralazine.  Acute on chronic renal insufficiency - per nephrology: baseline creat 1.9- 2.1, due to DM/HTN. Creat has been  trending downwards 3.51--> 2.45 ( 7/24 ). Cr in the .  -- She already has f/u with Dr. Erling Cruz in August 2015.  She also has a nephrologist closer to home.  -- Hx L nephrectomy '01 for RCC   Anemia, hemoglobin relatively stable in review of recent lab work-better today. No obvious bleeding. Question chronic with renal disease, although cannot locate old lab work at this time. -- Guaiac stool negative. -- Ferratin 320. Sat ratio: 14. TIBC 184; Iron 25. -- Start iron supplementation. F/u with her regular GI as an outpatient.   HLD- TC 239; TG 234; HDL 32; LDL 160. Continue statin.   Gout - continue colchicine   Deconditioning: continue PT.   Plan d/c today.   Signed, Margrit Minner S.

## 2014-04-17 NOTE — Progress Notes (Signed)
Discharge medication, education, follow-up appts, and prescriptions reviewed with pt, pt stated understanding and that she had no questions, IV and tele removed, pt called family to make them aware of discharge Rickard Rhymes, RN

## 2014-04-17 NOTE — Progress Notes (Signed)
Inpatient Diabetes Program Recommendations  AACE/ADA: New Consensus Statement on Inpatient Glycemic Control (2013)  Target Ranges:  Prepandial:   less than 140 mg/dL      Peak postprandial:   less than 180 mg/dL (1-2 hours)      Critically ill patients:  140 - 180 mg/dL   Inpatient Diabetes Program Recommendations Insulin - Basal: decrease NPH morning dose to 35 units  Correction (SSI): decrease correction to sensitive scale TID HgbA1C: Please consider ordering an A1C to evaluate glycemic control over the past 2-3 months. Thank you  Raoul Pitch BSN, RN,CDE Inpatient Diabetes Coordinator [phone] (team pager)

## 2014-04-17 NOTE — Discharge Instructions (Signed)
Weigh daily Call [phone] if weight climbs more than 3 pounds in a day or 5 pounds in a week. No salt to very little salt in your diet.  No more than 2000 mg in a day. Call if increased shortness of breath or increased swelling.   Heart Healthy diabetic diet   Call if chest pain or any problems or questions.

## 2014-04-17 NOTE — Discharge Summary (Signed)
Physician Discharge Summary       Patient ID: Alyssa [name] MRN: [id number] DOB/AGE: August 18, 1946 68 y.o.  Admit date: 04/05/2014 Discharge date: 04/17/2014  Discharge Diagnoses:  Principal Problem:   NSTEMI (non-ST elevated myocardial infarction), in presence of Malginant Hypertension Active Problems:   Acute respiratory failure with hypoxia   Malignant hypertension   Acute renal failure, improved   Hypertensive emergency   CKD (chronic kidney disease) stage 4, GFR 15-29 ml/min   Acute pulmonary edema, resolved   DIABETES MELLITUS, TYPE II   HYPERLIPIDEMIA   Cardiac pacemaker in situ   Coronary atherosclerosis of native coronary artery   Anemia   Single kidney secondary to hx of Lt nephrectomy for renal cell CA   Discharged Condition: good  Primary Cardiologist: Dr. Domenic Polite  Procedures: none  Hospital Course: Alyssa [name] is a 68 y.o.female history of SSS with MDT PPM, mild non-obstructive CAD by cath 2007, DM2, HTN, CKD IV, gout, hyperlipidemia, renal cell CA with prior left nephrectomy admitted 7/16 with Malignant hypertension/NSTEMI.  She presented with episodes of left chest pain, arm pain, and jaw pain on and off overnight. + SOB. Has had similar symptoms previously but not this intense or long in duration. Reported strict compliance with medications, including her antihypertensives.  BP on admit was 224/84 (MAP 130). Mild pulmonary edema on CXR.  She was placed on NTG drip, heparin held until BP controlled to prevent cranial bleed.  Troponin Peaked > 20. Echo with Ef 50-55%, mild MV regurg. and moderately calcified annulus. LA was severely dilated.   On 04/07/14  With improved BP control she suddenly developed acute hypoxia with flash pulmonary edema, further diuresis was intiated.  NTG drip resumed. BiPap was added and CCM consulted.  Renal consulted as well- she is followed with Dr. Hinda Lenis in [place].  She continued on BiPap until the 21st.  By then breathing better and  BP 116/50, labile at times. Pt developed acute renal failure and diuretics held.  Nephrology felt acute renal failure due to malignant HTN with RBCs in the urine-  Malignant HTN can cause MAHA as well as proteinuria + dysmorphic RBC's in the urine.  There was no incadication for dialysis- renal function stabilized.  Her diabetes ran high and meds adjusted. See below for each problem and plan.   NSTEMI in the setting of Malignant hypertension. Troponin >20.  -- Plan for medical therapy at this point particularly in light of renal insufficiency and risk of contrast nephropathy.  -- History of nonobstructive CAD at cardiac catheterization in 2007. LVEF 50-55% at this time with possible apical hypokinesis, elevated filling pressures.   Malignant hypertension with fluctuating blood pressures.  -- Patient also being followed by Nephrology. Secondary workup with assessment of urine metanephrines and catecholamines in normal range.  -- Continue clonidine 0.3mg  TID, coreg 25mg  BID, amlodipine 10mg  qd, Lasix 20mg  BID. Now off of ARB due to renal insufficiency. Start hydralazine.   ACUTE RESPIRATORY FAILURE: improved-off BiPap since the 22nd. pt was resumed on low dose lasix to help with pulmonary edema prevention and BP.  Acute on chronic renal insufficiency - per nephrology: baseline creat 1.9- 2.1, due to DM/HTN. Creat has been trending downwards 3.51--> 2.45 ( 7/24 ). Cr in the .  -- She already has f/u with Dr. Erling Cruz in August 2015. She also has a nephrologist closer to home.  -- Hx L nephrectomy '01 for RCC   Anemia, hemoglobin relatively stable in review of recent lab work-better today.  No obvious bleeding. Question chronic with renal disease, although cannot locate old lab work at this time.  -- Guaiac stool negative.  -- Ferratin 320. Sat ratio: 14. TIBC 184; Iron 25.  -- Start iron supplementation. F/u with her regular GI as an outpatient- she was seen by Dr. Fuller Plan here at [facility].  She will  follow up with Dr. Barney Drain for outpt consideration of colonoscopy and possible EGD.   HLD- TC 239; TG 234; HDL 32; LDL 160. Continue statin.   Gout - continue colchicine   Deconditioning: continue PT at home. Using rolling walker.  By 04/17/14 she was found stable for discharge home by Dr. Beau Fanny. She will need home health PT.    Consults: cardiology, pulmonary/intensive care, GI and nephrology  Significant Diagnostic Studies:   PK troponin > 20   TSH 3.680  BMET    Component Value Date/Time   NA 141 04/17/2014 0525   K 4.1 04/17/2014 0525   CL 103 04/17/2014 0525   CO2 23 04/17/2014 0525   GLUCOSE 133* 04/17/2014 0525   BUN 43* 04/17/2014 0525   CREATININE 2.66* 04/17/2014 0525   CREATININE 2.11* 10/02/2013 1638   CALCIUM 8.8 04/17/2014 0525   GFRNONAA 17* 04/17/2014 0525   GFRAA 20* 04/17/2014 0525   Pk Cr 3.30 and BUN 57  CBC    Component Value Date/Time   WBC 6.6 04/17/2014 0525   RBC 3.08* 04/17/2014 0525   RBC 2.65* 04/16/2014 0515   HGB 9.1* 04/17/2014 0525   HCT 28.1* 04/17/2014 0525   PLT 369 04/17/2014 0525   MCV 91.2 04/17/2014 0525   MCH 29.5 04/17/2014 0525   MCHC 32.4 04/17/2014 0525   RDW 13.8 04/17/2014 0525   LYMPHSABS 1.9 04/10/2014 0226   MONOABS 0.7 04/10/2014 0226   EOSABS 0.2 04/10/2014 0226   BASOSABS 0.0 04/10/2014 0226    Lipid Panel     Component Value Date/Time   CHOL 239* 04/06/2014 0200   TRIG 234* 04/06/2014 0200   HDL 32* 04/06/2014 0200   CHOLHDL 7.5 04/06/2014 0200   VLDL 47* 04/06/2014 0200   LDLCALC 160* 04/06/2014 0200   Iron/TIBC/Ferritin/ %Sat    Component Value Date/Time   IRON 35* 04/16/2014 0515   TIBC 200* 04/16/2014 0515   FERRITIN 688* 04/16/2014 0515   IRONPCTSAT 18* 04/16/2014 0515  Vit B 12 479  Stool Hme Neg.  Urine cr. 1.16, urine catecholamines 17, urine epinephrine 24 hr urine 3, norepinephrine 24 urine 14  BNP (last 3 results)  Recent Labs  04/05/14 0749 04/08/14 0950 04/10/14 0226  PROBNP 2112.0* 4974.0* 5125.0*     PORTABLE CHEST - 1 VIEW 04/10/14 COMPARISON: 04/09/2014.  FINDINGS:  The heart is enlarged but stable. Persistent pulmonary edema,  unchanged. Small pleural effusions and bibasilar atelectasis  persist.  IMPRESSION:  Persistent CHF.   RENAL/URINARY TRACT ULTRASOUND COMPLETE 04/10/14 COMPARISON: None.  FINDINGS:  Right Kidney:  Length: 11.8 cm. Diffusely increased parenchymal echogenicity noted.  No mass or hydronephrosis visualized.  Left Kidney:  Length: Surgically absent.  Bladder:  Empty with Foley catheter in place.  IMPRESSION:  Previous left nephrectomy.  Increased parenchymal echogenicity of the right kidney, consistent  with medical renal disease. No evidence of hydronephrosis.   2D Echo: 04/06/14 Study Conclusions - Left ventricle: The cavity size was normal. There was moderate concentric hypertrophy. Systolic function was normal. The estimated ejection fraction was in the range of 50% to 55%. Possible hypokinesis of the apical myocardium. Doppler parameters are  consistent with high ventricular filling pressure. - Mitral valve: Moderately calcified annulus. There was mild regurgitation. - Left atrium: The atrium was severely dilated.  EKG: Atrial-sensed ventricular-paced rhythm Abnormal ECG No significant change since last tracing  Discharge Exam: Blood pressure 167/72, pulse 66, temperature 97.9 F (36.6 C), temperature source Oral, resp. rate 18, height 5\' 4"  (1.626 m), weight 222 lb 0.1 oz (100.7 kg), SpO2 98.00%.   Disposition: Home       Discharge Instructions   Amb Referral to Cardiac Rehabilitation    Complete by:  As directed      Face-to-face encounter (required for Medicare/Medicaid patients)    Complete by:  As directed   [facility] certify that this patient is under my care and that I, or a nurse practitioner or physician's assistant working with me, had a face-to-face encounter that meets the physician face-to-face encounter  requirements with this patient on 04/17/2014. The encounter with the patient was in whole, or in part for the following medical condition(s) which is the primary reason for home health care (List medical condition): Malignant HTN, NSTEMI, acute respiratory failure with flash pulmonary edema.  The encounter with the patient was in whole, or in part, for the following medical condition, which is the primary reason for home health care:  Malignant HTN and NSTEMI  I certify that, based on my findings, the following services are medically necessary home health services:  Physical therapy  My clinical findings support the need for the above services:   Unsafe ambulation due to balance issues Unable to leave home safely without assistance and/or assistive device    Further, I certify that my clinical findings support that this patient is homebound due to:  Unable to leave home safely without assistance  Reason for Medically Necessary Home Health Services:   Therapy- Personnel officer, Public librarian Therapy- Home Adaptation to Facilitate Safety Therapy- Therapeutic Exercises to Increase Strength and Endurance       Home Health    Complete by:  As directed   To provide the following care/treatments:  PT            Medication List    STOP taking these medications       diltiazem 120 MG tablet  Commonly known as:  CARDIZEM     metoprolol 100 MG tablet  Commonly known as:  LOPRESSOR     tiZANidine 4 MG tablet  Commonly known as:  ZANAFLEX     TRIBENZOR 40-5-25 MG Tabs  Generic drug:  Olmesartan-Amlodipine-HCTZ      TAKE these medications       acetaminophen 325 MG tablet  Commonly known as:  TYLENOL  Take 2 tablets (650 mg total) by mouth every 4 (four) hours as needed for headache or mild pain.     allopurinol 100 MG tablet  Commonly known as:  ZYLOPRIM  Take 100 mg by mouth daily.     amLODipine 10 MG tablet  Commonly known as:  NORVASC  Take 1 tablet (10 mg  total) by mouth daily.     aspirin 81 MG EC tablet  Take 1 tablet (81 mg total) by mouth daily.     atorvastatin 20 MG tablet  Commonly known as:  LIPITOR  Take 1 tablet (20 mg total) by mouth daily at 6 PM.     carvedilol 25 MG tablet  Commonly known as:  COREG  Take 1 tablet (25 mg total) by mouth 2 (two) times daily  with a meal.     cloNIDine 0.3 MG tablet  Commonly known as:  CATAPRES  Take 0.3 mg by mouth 3 (three) times daily.     colchicine 0.6 MG tablet  Take 1 tablet (0.6 mg total) by mouth 2 (two) times daily.     furosemide 20 MG tablet  Commonly known as:  LASIX  Take 1 tablet (20 mg total) by mouth 2 (two) times daily.     HUMALOG MIX 75/25 (75-25) 100 UNIT/ML Susp injection  Generic drug:  insulin lispro protamine-lispro  Inject 15-55 Units into the skin 2 (two) times daily with a meal. 55 units in the am and 15 in evening depending on CBG reading     hydrALAZINE 10 MG tablet  Commonly known as:  APRESOLINE  Take 1 tablet (10 mg total) by mouth every 8 (eight) hours.     nitroGLYCERIN 0.4 MG SL tablet  Commonly known as:  NITROSTAT  Place 1 tablet (0.4 mg total) under the tongue every 5 (five) minutes x 3 doses as needed for chest pain.     omeprazole 20 MG capsule  Commonly known as:  PRILOSEC  Take 20 mg by mouth 2 (two) times daily before a meal.          Discharge Instructions: Weigh daily Call [phone] if weight climbs more than 3 pounds in a day or 5 pounds in a week. No salt to very little salt in your diet.  No more than 2000 mg in a day. Call if increased shortness of breath or increased swelling.   Heart Healthy diabetic diet   Call if chest pain or any problems or questions.  Signed: Isaiah Serge Nurse Practitioner-Certified [facility]: [facility] 04/17/2014, 12:44 PM  Time spent on discharge : >45 minutes.    I have examined the patient and reviewed assessment and plan and discussed with patient.  Agree with above  as stated.  Feels well walking.  Will need renal f/u and cardiology f/u fro diuretic adjustment and f/u [facility]. Schedule with Jory Sims.  Arleny Kruger S.

## 2014-04-23 ENCOUNTER — Ambulatory Visit (INDEPENDENT_AMBULATORY_CARE_PROVIDER_SITE_OTHER): Payer: Medicare PPO | Admitting: Adult Health

## 2014-04-23 ENCOUNTER — Encounter: Payer: Self-pay | Admitting: Adult Health

## 2014-04-23 VITALS — BP 152/72 | HR 77 | Ht 64.0 in | Wt 216.0 lb

## 2014-04-23 DIAGNOSIS — I25119 Atherosclerotic heart disease of native coronary artery with unspecified angina pectoris: Secondary | ICD-10-CM

## 2014-04-23 DIAGNOSIS — I10 Essential (primary) hypertension: Secondary | ICD-10-CM

## 2014-04-23 DIAGNOSIS — I2510 Atherosclerotic heart disease of native coronary artery without angina pectoris: Secondary | ICD-10-CM

## 2014-04-23 DIAGNOSIS — I209 Angina pectoris, unspecified: Secondary | ICD-10-CM

## 2014-04-23 MED ORDER — ISOSORBIDE MONONITRATE ER 30 MG PO TB24: 30.0000 mg | ORAL_TABLET | Freq: Every day | ORAL | Status: AC

## 2014-04-23 NOTE — Progress Notes (Signed)
HPI: Alyssa [name] is a 68 year old female patient of Dr. Domenic Polite that we follow for ongoing assessment and management of sick sinus syndrome with MDT pacemaker, nonobstructive CAD by cardiac catheterization in 2007, normal stress test 09/2013 and malignant hypertension, with recent admission to [facility] hospital in the setting of non-ST elevation MI and acute respiratory failure July 2015.[name] Other history includes renal cell carcinoma with prior left nephrectomy. On admission blood pressure was 224/84. Some have mild pulmonary edema chest x-ray. EKG revealing EF of 50-55%, mild MV regurg with moderately calcified annulus. The LAD was severely dilated.  The patient had renal consultation was completed and was felt that renal failure was related to malignant hypertension. She was found to have an MAHA as well as proteinuria. Also diabetes was not well controlled. She was to followup with Dr. Erling Cruz or Dr. Lowanda Foster as outpatient. Continue clonidine 0.3 mg 3 times a day, Coreg 25 mg twice a day, amlodipine 10 mg daily, Lasix 20 mg twice a day. She was taken off of ARB due to renal insufficiency and started on hydralazine. She is here for post hospitalization followup.  She has continued to have some DOE, and some chest discomfort with one episode of jaw pain since discharge. She has taken SL NTG for relief X 2.   Allergies  Allergen Reactions  . Simvastatin     May have caused nausea or itching  . Verapamil    Current Outpatient Prescriptions  Medication Sig Dispense Refill  . acetaminophen (TYLENOL) 325 MG tablet Take 2 tablets (650 mg total) by mouth every 4 (four) hours as needed for headache or mild pain.      [name] allopurinol (ZYLOPRIM) 100 MG tablet Take 100 mg by mouth daily.        [name] amLODipine (NORVASC) 10 MG tablet Take 1 tablet (10 mg total) by mouth daily.  30 tablet  6  . aspirin EC 81 MG EC tablet Take 1 tablet (81 mg total) by mouth daily.      [name] atorvastatin (LIPITOR) 20 MG tablet  Take 1 tablet (20 mg total) by mouth daily at 6 PM.  30 tablet  6  . carvedilol (COREG) 25 MG tablet Take 1 tablet (25 mg total) by mouth 2 (two) times daily with a meal.  60 tablet  6  . cloNIDine (CATAPRES) 0.3 MG tablet Take 0.3 mg by mouth 3 (three) times daily.        . colchicine 0.6 MG tablet Take 1 tablet (0.6 mg total) by mouth 2 (two) times daily.  60 tablet  2  . furosemide (LASIX) 20 MG tablet Take 1 tablet (20 mg total) by mouth 2 (two) times daily.  60 tablet  6  . HUMALOG MIX 75/25 (75-25) 100 UNIT/ML SUSP Inject 15-55 Units into the skin 2 (two) times daily with a meal. 55 units in the am and 15 in evening depending on CBG reading      . hydrALAZINE (APRESOLINE) 10 MG tablet Take 1 tablet (10 mg total) by mouth every 8 (eight) hours.  90 tablet  6  . nitroGLYCERIN (NITROSTAT) 0.4 MG SL tablet Place 1 tablet (0.4 mg total) under the tongue every 5 (five) minutes x 3 doses as needed for chest pain.  25 tablet  4  . omeprazole (PRILOSEC) 20 MG capsule Take 20 mg by mouth 2 (two) times daily before a meal.        No current facility-administered medications for this visit.  Past Medical History  Diagnosis Date  . Sick sinus syndrome 2004    Medtronic PPM 10/2004 - Dr. Lovena Le  . Coronary atherosclerosis of native coronary artery     Nonobstructive; 30-40% LAD, Cx, and RCA in 2007; normal EF by echo in 2006; nl stress nuclear in 2008  . Chronic diastolic heart failure   . Type 2 diabetes mellitus     insulin requiring.   . Essential hypertension, benign   . Hyperlipidemia   . Renal cell carcinoma     s/p nephrectomy  . GERD (gastroesophageal reflux disease)   . Allergic rhinitis   . Low back pain     Scoliosis and degenerative disc disease with  . Osteoarthritis   . Gout   . Hx of colonic polyps     none on study of 2010, but adenomatous polyps on 3 prior colonoscopies per Dr Oneida Alar.   . CKD (chronic kidney disease) stage 2, GFR 60-89 ml/min   . Anemia   . CKD (chronic  kidney disease) stage 4, GFR 15-29 ml/min 04/17/2014  . Acute pulmonary edema, resolved 04/17/2014  . Single kidney secondary to hx of Lt nephrectomy for renal cell CA 04/17/2014    Past Surgical History  Procedure Laterality Date  . Cataract extraction Bilateral   . Tonsillectomy    . Cholecystectomy      Approx. 1995  . Abdominal hysterectomy      1974  . Nephrectomy      Left nephrectomy- renal cell carcinoma  . Knee surgery    . Insert / replace / remove pacemaker      Medtronic Dual chamber pacemaker 11/11/2004  . Back surgery    . Colonoscopy w/ polypectomy      ZTI:[id number] of systems complete and found to be negative unless listed above  PHYSICAL EXAM BP 152/72  Pulse 77  Ht 5\' 4"  (1.626 m)  Wt 216 lb (97.977 kg)  BMI 37.06 kg/m2  SpO2 98% General: Well developed, well nourished, in no acute distress, obese.  Head: Eyes PERRLA, No xanthomas.   Normal cephalic and atramatic  Lungs: Clear bilaterally to auscultation and percussion. Heart: HRRR S1 S2, without MRG.  Pulses are 2+ & equal.            No carotid bruit. No JVD.  No abdominal bruits. No femoral bruits. Abdomen: Bowel sounds are positive, abdomen soft and non-tender without masses or                  Hernia's noted. Msk:  Back normal, normal gait. Normal strength and tone for age. Extremities: No clubbing, cyanosis or edema.  DP +1 Neuro: Alert and oriented X 3. Psych:  Good affect, responds appropriately   ASSESSMENT AND PLAN

## 2014-04-23 NOTE — Assessment & Plan Note (Signed)
To see Dr.Befafadu in 2 days.Will defer labs to him.

## 2014-04-23 NOTE — Progress Notes (Deleted)
Name: Alyssa [name]    DOB: [birth year]  Age: 68 y.o.  MR#: [id number]       PCP:  Maggie Font, MD      Insurance: Payor: [employer]ICARE / Plan: [employer] MEDICARE CHOICE PPO / Product Type: *No Product type* /   CC:    Chief Complaint  Patient presents with  . Hypertension    VS Filed Vitals:   04/23/14 1349  BP: 152/72  Pulse: 77  Height: 5\' 4"  (1.626 m)  Weight: 216 lb (97.977 kg)  SpO2: 98%    Weights Current Weight  04/23/14 216 lb (97.977 kg)  04/17/14 222 lb 0.1 oz (100.7 kg)  04/17/14 222 lb 0.1 oz (100.7 kg)    Blood Pressure  BP Readings from Last 3 Encounters:  04/23/14 152/72  04/17/14 162/70  04/17/14 162/70     Admit date:  (Not on file) Last encounter with RMR:  Visit date not found   Allergy Simvastatin and Verapamil  Current Outpatient Prescriptions  Medication Sig Dispense Refill  . acetaminophen (TYLENOL) 325 MG tablet Take 2 tablets (650 mg total) by mouth every 4 (four) hours as needed for headache or mild pain.      [name] allopurinol (ZYLOPRIM) 100 MG tablet Take 100 mg by mouth daily.        [name] amLODipine (NORVASC) 10 MG tablet Take 1 tablet (10 mg total) by mouth daily.  30 tablet  6  . aspirin EC 81 MG EC tablet Take 1 tablet (81 mg total) by mouth daily.      [name] atorvastatin (LIPITOR) 20 MG tablet Take 1 tablet (20 mg total) by mouth daily at 6 PM.  30 tablet  6  . carvedilol (COREG) 25 MG tablet Take 1 tablet (25 mg total) by mouth 2 (two) times daily with a meal.  60 tablet  6  . cloNIDine (CATAPRES) 0.3 MG tablet Take 0.3 mg by mouth 3 (three) times daily.        . colchicine 0.6 MG tablet Take 1 tablet (0.6 mg total) by mouth 2 (two) times daily.  60 tablet  2  . furosemide (LASIX) 20 MG tablet Take 1 tablet (20 mg total) by mouth 2 (two) times daily.  60 tablet  6  . HUMALOG MIX 75/25 (75-25) 100 UNIT/ML SUSP Inject 15-55 Units into the skin 2 (two) times daily with a meal. 55 units in the am and 15 in evening depending on CBG reading      .  hydrALAZINE (APRESOLINE) 10 MG tablet Take 1 tablet (10 mg total) by mouth every 8 (eight) hours.  90 tablet  6  . nitroGLYCERIN (NITROSTAT) 0.4 MG SL tablet Place 1 tablet (0.4 mg total) under the tongue every 5 (five) minutes x 3 doses as needed for chest pain.  25 tablet  4  . omeprazole (PRILOSEC) 20 MG capsule Take 20 mg by mouth 2 (two) times daily before a meal.        No current facility-administered medications for this visit.    Discontinued Meds:   There are no discontinued medications.  Patient Active Problem List   Diagnosis Date Noted  . Acute renal failure, improved 04/17/2014  . CKD (chronic kidney disease) stage 4, GFR 15-29 ml/min 04/17/2014  . Acute pulmonary edema, resolved 04/17/2014  . 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 Anemia   . Malignant hypertension 04/09/2014  . Acute respiratory failure with hypoxia 04/07/2014  .  Pulmonary edema 04/07/2014  . Hypertensive emergency 04/05/2014  . NSTEMI (non-ST elevated myocardial infarction), in presence of Malginant Hypertension 04/05/2014  . Coronary atherosclerosis of native coronary artery 11/23/2010  . SICK SINUS SYNDROME 12/15/2009  . Cardiac pacemaker in situ 12/15/2009  . DIABETES MELLITUS, TYPE II 10/26/2006  . HYPERLIPIDEMIA 10/26/2006  . Essential hypertension, benign 10/26/2006  . GERD 10/26/2006    LABS    Component Value Date/Time   NA 141 04/17/2014 0525   NA 140 04/16/2014 0515   NA 139 04/15/2014 0558   K 4.1 04/17/2014 0525   K 3.7 04/16/2014 0515   K 3.8 04/15/2014 0558   CL 103 04/17/2014 0525   CL 103 04/16/2014 0515   CL 102 04/15/2014 0558   CO2 23 04/17/2014 0525   CO2 25 04/16/2014 0515   CO2 24 04/15/2014 0558   GLUCOSE 133* 04/17/2014 0525   GLUCOSE 164* 04/16/2014 0515   GLUCOSE 69* 04/15/2014 0558   BUN 43* 04/17/2014 0525   BUN 43* 04/16/2014 0515   BUN 42* 04/15/2014 0558   CREATININE 2.66* 04/17/2014 0525   CREATININE 2.61* 04/16/2014 0515    CREATININE 2.57* 04/15/2014 0558   CREATININE 2.11* 10/02/2013 1638   CREATININE 1.42* 01/05/2011 0950   CALCIUM 8.8 04/17/2014 0525   CALCIUM 8.5 04/16/2014 0515   CALCIUM 8.6 04/15/2014 0558   GFRNONAA 17* 04/17/2014 0525   GFRNONAA 18* 04/16/2014 0515   GFRNONAA 18* 04/15/2014 0558   GFRAA 20* 04/17/2014 0525   GFRAA 21* 04/16/2014 0515   GFRAA 21* 04/15/2014 0558   CMP     Component Value Date/Time   NA 141 04/17/2014 0525   K 4.1 04/17/2014 0525   CL 103 04/17/2014 0525   CO2 23 04/17/2014 0525   GLUCOSE 133* 04/17/2014 0525   BUN 43* 04/17/2014 0525   CREATININE 2.66* 04/17/2014 0525   CREATININE 2.11* 10/02/2013 1638   CALCIUM 8.8 04/17/2014 0525   PROT 5.9* 04/10/2014 0226   ALBUMIN 2.1* 04/10/2014 0226   AST 14 04/10/2014 0226   ALT 12 04/10/2014 0226   ALKPHOS 69 04/10/2014 0226   BILITOT 0.4 04/10/2014 0226   GFRNONAA 17* 04/17/2014 0525   GFRAA 20* 04/17/2014 0525       Component Value Date/Time   WBC 6.6 04/17/2014 0525   WBC 5.8 04/16/2014 0515   WBC 5.5 04/15/2014 0558   HGB 9.1* 04/17/2014 0525   HGB 7.9* 04/16/2014 0515   HGB 7.7* 04/15/2014 0558   HCT 28.1* 04/17/2014 0525   HCT 24.2* 04/16/2014 0515   HCT 23.4* 04/15/2014 0558   MCV 91.2 04/17/2014 0525   MCV 91.3 04/16/2014 0515   MCV 90.3 04/15/2014 0558    Lipid Panel     Component Value Date/Time   CHOL 239* 04/06/2014 0200   TRIG 234* 04/06/2014 0200   HDL 32* 04/06/2014 0200   CHOLHDL 7.5 04/06/2014 0200   VLDL 47* 04/06/2014 0200   LDLCALC 160* 04/06/2014 0200    ABG    Component Value Date/Time   PHART 7.369 01/10/2010 1215   PCO2ART 41.6 01/10/2010 1215   PO2ART 64.4* 01/10/2010 1215   HCO3 23.4 01/10/2010 1215   TCO2 21.2 01/10/2010 1215   ACIDBASEDEF 1.2 01/10/2010 1215   O2SAT 92.8 01/10/2010 1215     Lab Results  Component Value Date   TSH 2.710 04/17/2014   BNP (last 3 results)  Recent Labs  04/05/14 0749 04/08/14 0950 04/10/14 0226  PROBNP 2112.0* 4974.0* 5125.0*   Cardiac Panel (  last 3 results) No  results found for this basename: CKTOTAL, CKMB, TROPONINI, RELINDX,  in the last 72 hours  Iron/TIBC/Ferritin/ %Sat    Component Value Date/Time   IRON 35* 04/16/2014 0515   TIBC 200* 04/16/2014 0515   FERRITIN 688* 04/16/2014 0515   IRONPCTSAT 18* 04/16/2014 0515     EKG Orders placed during the hospital encounter of 04/05/14  . ED EKG  . ED EKG  . EKG 12-LEAD  . EKG 12-LEAD  . EKG 12-LEAD  . EKG 12-LEAD  . EKG 12-LEAD  . EKG 12-LEAD  . EKG 12-LEAD  . EKG 12-LEAD  . EKG 12-LEAD  . EKG 12-LEAD  . EKG 12-LEAD  . EKG 12-LEAD  . EKG  . EKG 12-LEAD     Prior Assessment and Plan Problem List as of 04/23/2014     Cardiovascular and Mediastinum   Essential hypertension, benign   Last Assessment & Plan   02/03/2012 Office Visit Written 02/10/2012  8:22 PM by Evans Lance, MD     Her blood pressure is elevated today. I have asked the patient to reduce her sodium intake. She will continue her current meds.     SICK SINUS SYNDROME   Last Assessment & Plan   09/19/2013 Office Visit Written 09/19/2013  2:47 PM by Satira Sark, MD     Status post pacemaker placement, followed by Dr. Lovena Le. Normal device function at last check.    Coronary atherosclerosis of native coronary artery   Last Assessment & Plan   09/19/2013 Office Visit Written 09/19/2013  2:46 PM by Satira Sark, MD     Patient with history of nonobstructive CAD by catheterization in 2007. She is reporting intermittent exertional chest pain symptoms over the last year that she describes as being negative. Her ECG shows a dual chamber paced rhythm. Last ischemic workup was in 2008. We will therefore proceed with followup testing including an echocardiogram and a Lexiscan Cardiolite for ischemic surveillance and structural cardiac evaluation. No change in medication for now.    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 Berdine Addison. She is on omega-3 supplements at present.    Cardiac pacemaker in situ   Last Assessment & Plan   06/20/2013 Office Visit Written 06/22/2013  6:54 PM by Evans Lance, MD     She has normal device function. We'll recheck in several months.    Hypertensive emergency   Malignant hypertension   Anemia       Imaging: US Renal  04/10/2014   CLINICAL DATA:  Acute kidney injury.  Previous left nephrectomy.  EXAM: RENAL/URINARY TRACT ULTRASOUND COMPLETE  COMPARISON:  None.  FINDINGS: Right Kidney:  Length: 11.8 cm. Diffusely increased parenchymal echogenicity noted. No mass or hydronephrosis visualized.  Left Kidney:  Length: Surgically absent.  Bladder:  Empty with Foley catheter in place.  IMPRESSION: Previous left nephrectomy.  Increased parenchymal echogenicity of the right kidney, consistent with medical renal disease. No evidence of hydronephrosis.   Electronically Signed   By: Earle Gell M.D.   On: 04/10/2014 17:50   Dg Chest Port 1 View  04/10/2014   CLINICAL DATA:  Shortness of breath.  EXAM: PORTABLE CHEST - 1 VIEW  COMPARISON:  04/09/2014.  FINDINGS: The heart is enlarged but stable. Persistent pulmonary edema, unchanged. Small pleural effusions and bibasilar atelectasis persist.  IMPRESSION: Persistent CHF.   Electronically Signed   By: Kalman Jewels M.D.   On: 04/10/2014 07:24   Dg Chest Port 1 View  04/09/2014   CLINICAL DATA:  Pulmonary edema  EXAM: PORTABLE CHEST - 1 VIEW  COMPARISON:  Portable chest  x-ray of April 07, 2014  FINDINGS: The lung volumes remain low. The interstitial markings remain mildly increased but are less prominent. The cardiac silhouette remains enlarged. Its margins are better defined. The central pulmonary vascularity remains engorged but is also improved. The permanent pacemaker is unchanged in position. The bony thorax exhibits no acute change.  IMPRESSION: There has been mild interval decrease in the bilateral pulmonary interstitial edema consistent with improving CHF.   Electronically Signed   By: David  Martinique   On: 04/09/2014 07:54   Dg Chest Portable 1 View  04/05/2014   CLINICAL DATA:  Chest pain.  Short of breath.  EXAM: PORTABLE CHEST - 1 VIEW  COMPARISON:  04/29/2012  FINDINGS: Previous spinal fusion. Dual lead pacemaker with leads in the region of the right atrium and right ventricle. Heart at the upper limits of normal in size. Venous hypertension with interstitial pulmonary edema. No effusions.  IMPRESSION: Interstitial pulmonary edema.   Electronically Signed   By: Nelson Chimes M.D.   On: 04/05/2014 08:21   Dg Chest Port 1v Same Day  04/07/2014   CLINICAL DATA:  Shortness of breath.  Hypoxia.  EXAM: PORTABLE CHEST - 1 VIEW SAME DAY  COMPARISON:  04/05/2014  FINDINGS: Low lung volumes are again demonstrated. Heart size remains stable. Mild interstitial edema pattern again noted. There is mild worsening of atelectasis or infiltrate in the lung bases bilaterally. No definite pleural effusion seen. Dual lead transvenous pacemaker posterior spinal fixation rods remain in place.  IMPRESSION: Stable low lung volumes and probable diffuse pulmonary interstitial edema. Mild worsening of atelectasis or infiltrates in both lung bases.   Electronically Signed   By: Earle Gell M.D.   On: 04/07/2014 12:58

## 2014-04-23 NOTE — Assessment & Plan Note (Signed)
She remains slightly hypertensive on visit today. May need to increase hydralazine to 25 mg TID on next visit. She states that she has been tested for OSA and was found to be negative. Will continue to follow.

## 2014-04-23 NOTE — Assessment & Plan Note (Signed)
She has had some chest pressure associated with exertion, walking apprx 20 feet. She has taken NTG for relief which was helpful to her., She may be having increased BP with exertion with baseline hypertension. I will add isosorbide mono 30 mg daily. This will assist with BP and with angina symptoms and hopefully decrease need for frequent NTG use. Can consider repeat stress test if symptoms persist.

## 2014-04-23 NOTE — Patient Instructions (Signed)
Your physician recommends that you schedule a follow-up appointment in: Keep appointment with Dr Domenic Polite  Your physician has recommended you make the following change in your medication:   Start Isosorbide 30 mg daily.

## 2014-04-30 ENCOUNTER — Telehealth: Payer: Self-pay | Admitting: Cardiology

## 2014-04-30 NOTE — Telephone Encounter (Signed)
Annette with [facility] called about patient's  Blood pressure is 180/80. . States that Alyssa [name] is only taking her BP medication twice daily. States that she is very fatigue with no appetite, dry mouth, headaches.  Please call patient at her home. BP was taken approximately 1:00pm .

## 2014-05-01 ENCOUNTER — Encounter: Payer: Self-pay | Admitting: Gastroenterology

## 2014-05-10 ENCOUNTER — Ambulatory Visit: Payer: Medicare PPO | Admitting: Cardiology

## 2014-05-22 DEATH — deceased

## 2014-08-13 IMAGING — CR DG CHEST 1V PORT
1 series · 1 of 1 positions shown · non-contrast
Comparison: Portable chest x-ray April 07, 2014

CLINICAL DATA: Pulmonary edema

EXAM:
PORTABLE CHEST - 1 VIEW

[AP]
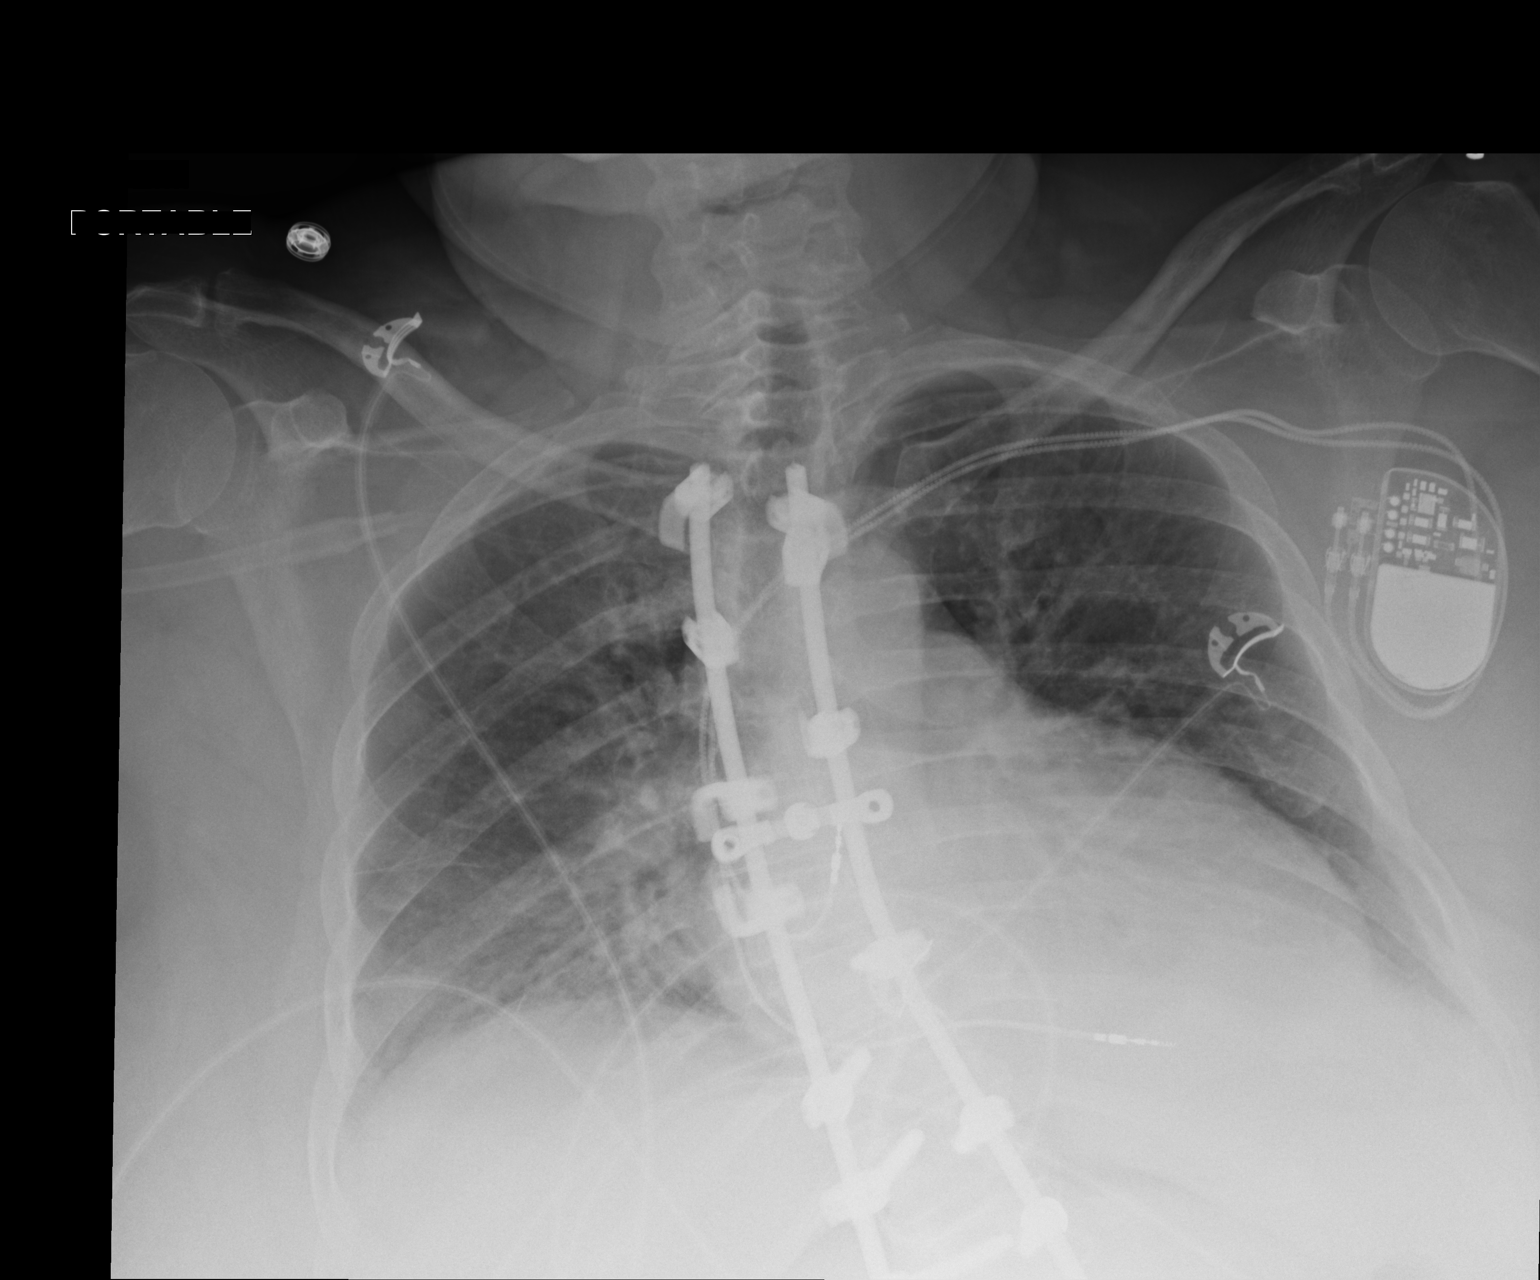

[1 of 1 positions shown; findings below may reference images not displayed]

FINDINGS: The lung volumes remain low. The interstitial markings remain mildly
increased but are less prominent. The cardiac silhouette remains
enlarged. Its margins are better defined. The central pulmonary
vascularity remains engorged but is also improved. The permanent
pacemaker is unchanged in position. The bony thorax exhibits no
acute change.
IMPRESSION: There has been mild interval decrease in the bilateral pulmonary
interstitial edema consistent with improving CHF.

## 2014-08-14 IMAGING — US US RENAL
1 series · 14 of 15 positions shown · non-contrast
Comparison: None.

CLINICAL DATA: Acute kidney injury.  Previous left nephrectomy.

EXAM:
RENAL/URINARY TRACT ULTRASOUND COMPLETE

[Series 1: us renal · 0.23mm/px · 14 of 15 slices shown]
[im 1/15]
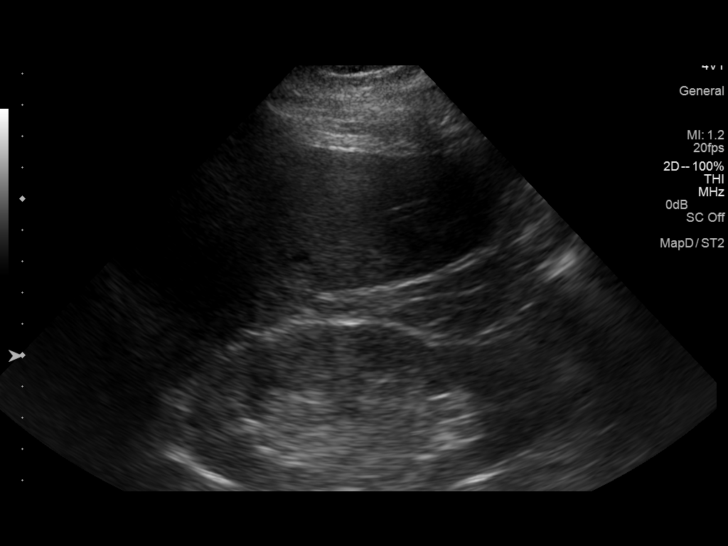
[im 2/15]
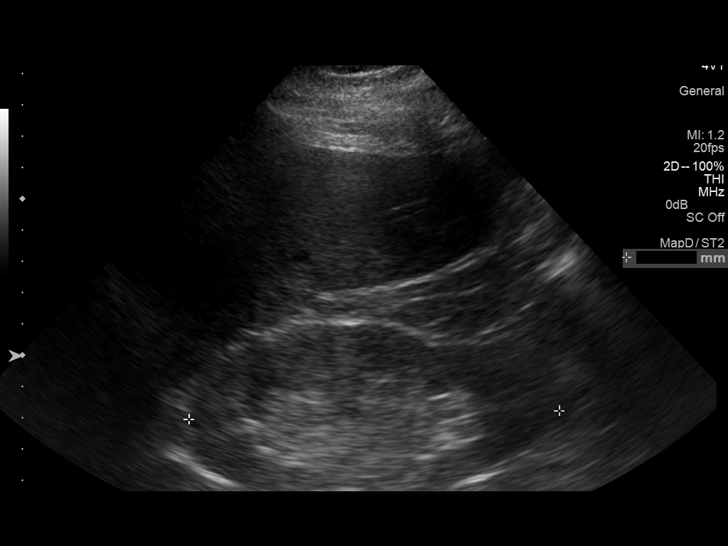
[im 3/15]
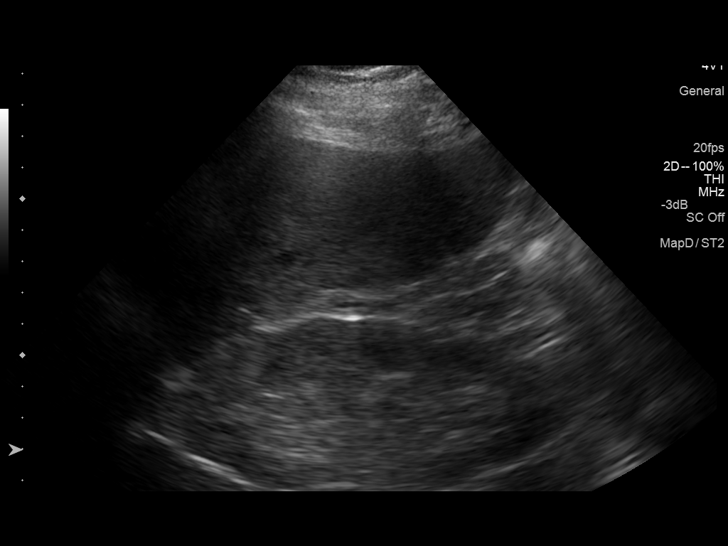
[im 4/15]
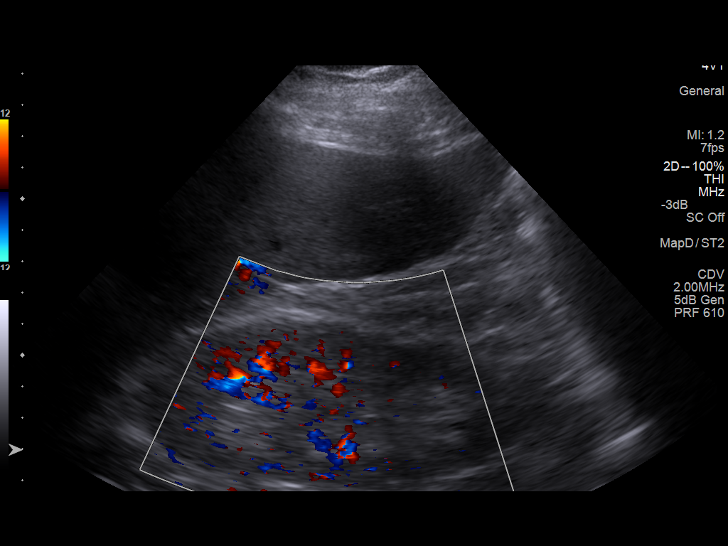
[im 5/15]
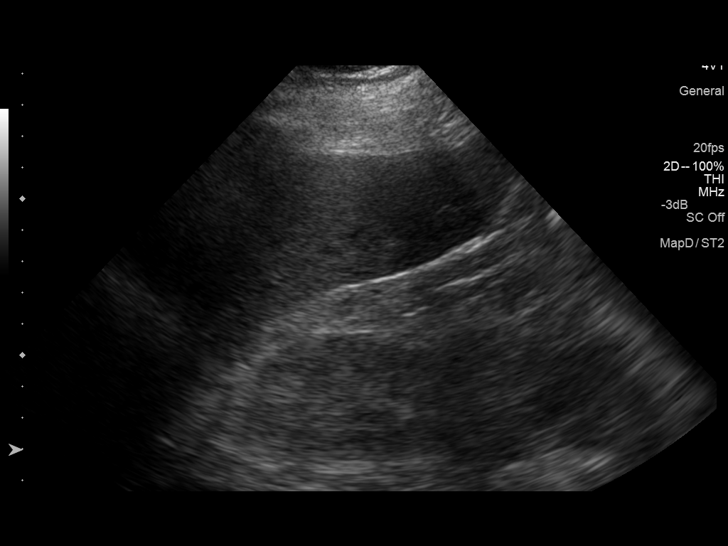
[im 6/15]
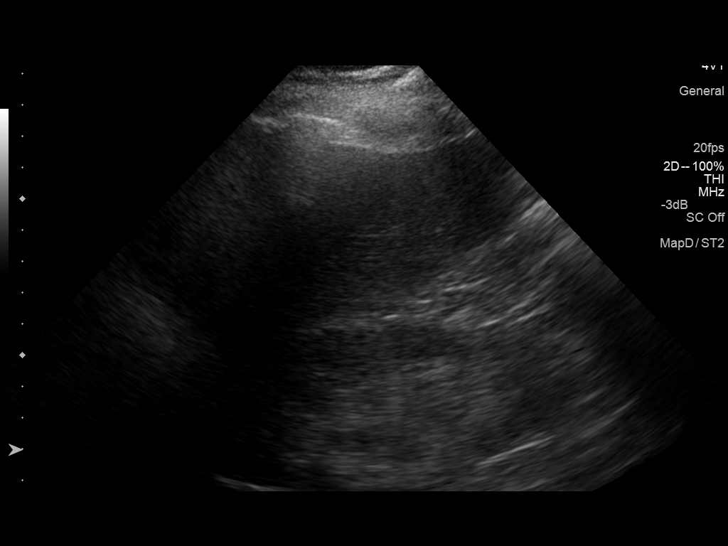
[im 7/15]
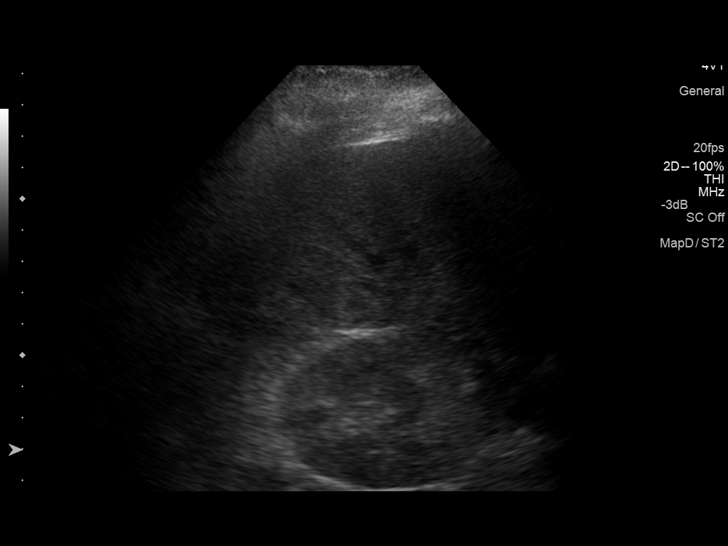
[im 9/15]
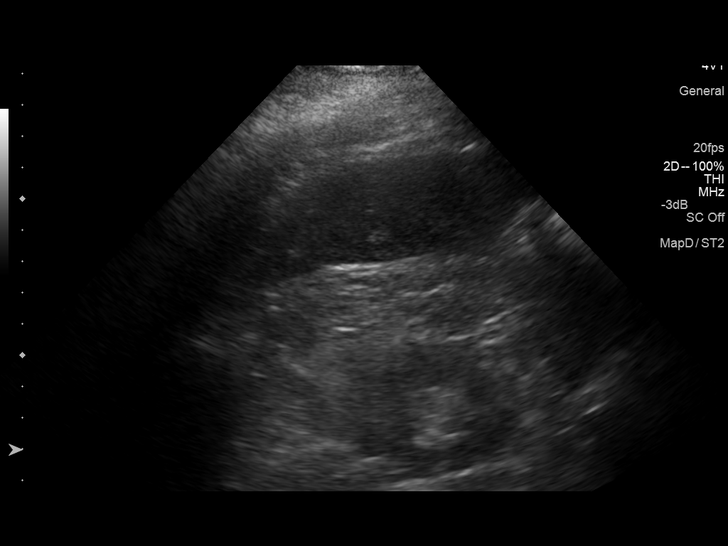
[im 10/15]
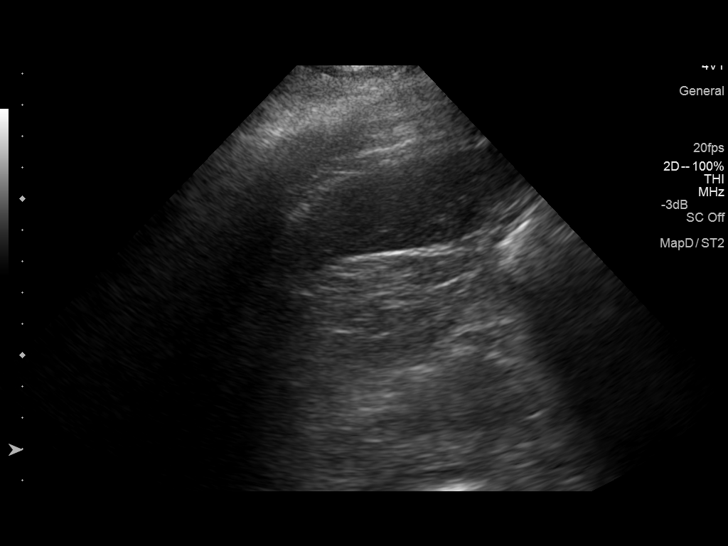
[im 11/15]
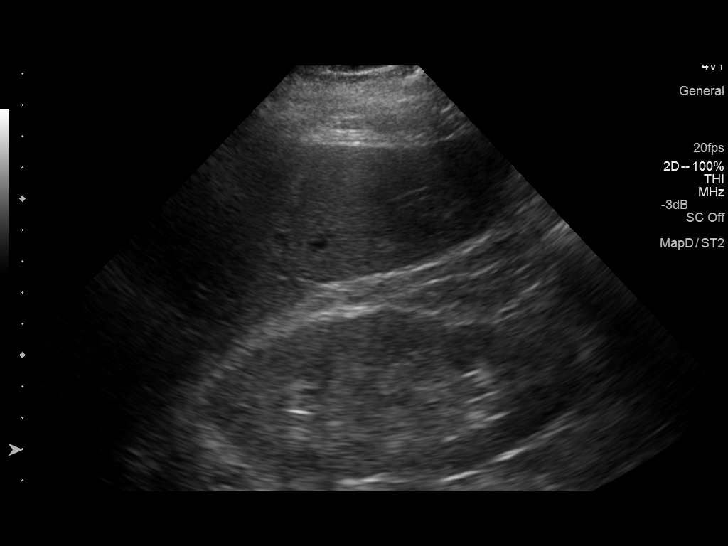
[im 12/15]
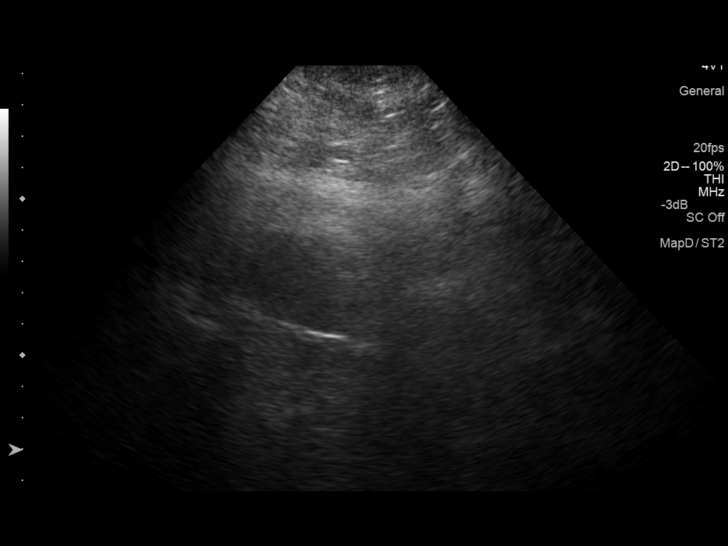
[im 13/15]
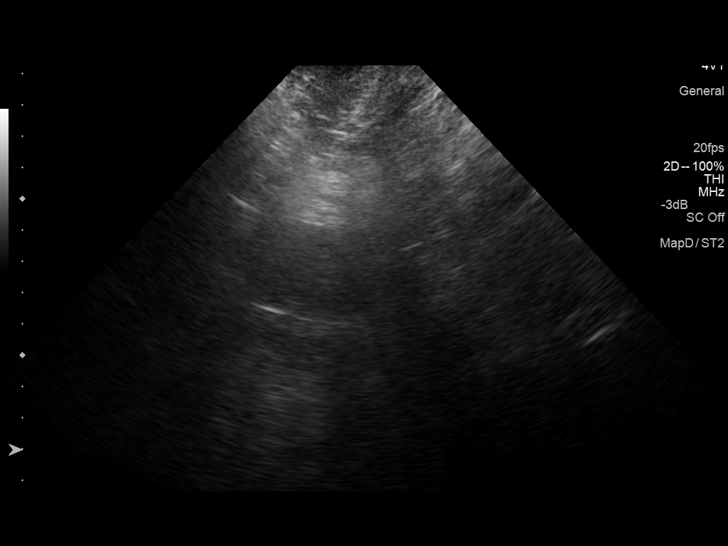
[im 14/15]
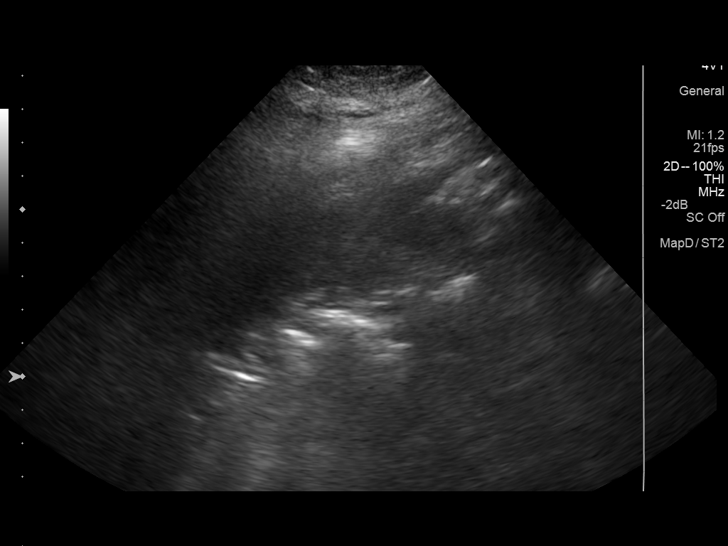
[im 15/15]
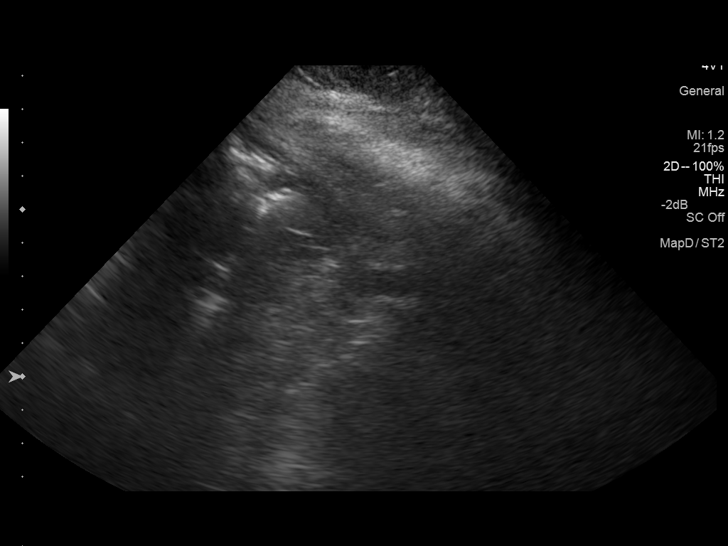

[14 of 15 positions shown; findings below may reference images not displayed]

FINDINGS: Right Kidney:

Length: 11.8 cm. Diffusely increased parenchymal echogenicity noted.
No mass or hydronephrosis visualized.

Left Kidney:

Length: Surgically absent.

Bladder:

Empty with Foley catheter in place.
IMPRESSION: Previous left nephrectomy.

Increased parenchymal echogenicity of the right kidney, consistent
with medical renal disease. No evidence of hydronephrosis.
